# Patient Record
Sex: Male | Born: 1960
Health system: Southern US, Community
[De-identification: ages and names within clinical notes are randomized; demographics above are authoritative.]

## PROBLEM LIST (undated history)

## (undated) DIAGNOSIS — K219 Gastro-esophageal reflux disease without esophagitis: Secondary | ICD-10-CM

## (undated) DIAGNOSIS — J302 Other seasonal allergic rhinitis: Secondary | ICD-10-CM

## (undated) DIAGNOSIS — Z8619 Personal history of other infectious and parasitic diseases: Secondary | ICD-10-CM

## (undated) DIAGNOSIS — J101 Influenza due to other identified influenza virus with other respiratory manifestations: Secondary | ICD-10-CM

## (undated) DIAGNOSIS — K648 Other hemorrhoids: Secondary | ICD-10-CM

## (undated) DIAGNOSIS — J45909 Unspecified asthma, uncomplicated: Secondary | ICD-10-CM

## (undated) DIAGNOSIS — I959 Hypotension, unspecified: Secondary | ICD-10-CM

## (undated) DIAGNOSIS — K225 Diverticulum of esophagus, acquired: Secondary | ICD-10-CM

## (undated) HISTORY — DX: Personal history of other infectious and parasitic diseases: Z86.19

## (undated) HISTORY — DX: Other hemorrhoids: K64.8

## (undated) HISTORY — DX: Hypotension, unspecified: I95.9

## (undated) HISTORY — PX: HERNIA REPAIR: SHX51

## (undated) HISTORY — DX: Gastro-esophageal reflux disease without esophagitis: K21.9

## (undated) HISTORY — DX: Diverticulum of esophagus, acquired: K22.5

## (undated) HISTORY — DX: Other seasonal allergic rhinitis: J30.2

## (undated) HISTORY — DX: Unspecified asthma, uncomplicated: J45.909

---
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
## 2008-05-14 HISTORY — PX: COLONOSCOPY: SHX174

## 2008-11-22 ENCOUNTER — Encounter: Payer: Self-pay | Admitting: Internal Medicine

## 2008-11-23 ENCOUNTER — Encounter: Payer: Self-pay | Admitting: Internal Medicine

## 2009-01-07 ENCOUNTER — Ambulatory Visit: Payer: Self-pay | Admitting: Internal Medicine

## 2009-01-20 ENCOUNTER — Ambulatory Visit: Payer: Self-pay | Admitting: Internal Medicine

## 2009-01-20 HISTORY — PX: COLONOSCOPY: SHX174

## 2010-06-13 NOTE — Miscellaneous (Signed)
Summary: previsit prep rx  Clinical Lists Changes  Medications: Added new medication of MOVIPREP 100 GM  SOLR (PEG-KCL-NACL-NASULF-NA ASC-C) As per prep instructions. - Signed Rx of MOVIPREP 100 GM  SOLR (PEG-KCL-NACL-NASULF-NA ASC-C) As per prep instructions.;  #1 x 0;  Signed;  Entered by: Jennye Boroughs RN;  Authorized by: Hilarie Fredrickson MD;  Method used: Electronically to [facility]. #[id number]*, [address] [place], [place], [place]  [address], Ph: [phone], Fax: [phone]    Prescriptions: MOVIPREP 100 GM  SOLR (PEG-KCL-NACL-NASULF-NA ASC-C) As per prep instructions.  #1 x 0   Entered by:   Jennye Boroughs RN   Authorized by:   Hilarie Fredrickson MD   Signed by:   Jennye Boroughs RN on 01/07/2009   Method used:   Electronically to        [facility]. [phone]* (retail)       [address]       [place], [place]  [address]       Ph: [phone]       Fax: [phone]   RxID:   [id number]

## 2010-06-13 NOTE — Letter (Signed)
Summary: [facility]   Imported By: Lester Guttenberg 01/28/2009 09:41:40  _____________________________________________________________________  External Attachment:    Type:   Image     Comment:   External Document

## 2010-06-13 NOTE — Letter (Signed)
Summary: Referral form/[facility]  Referral form/[facility]   Imported By: Lester Manhattan 01/28/2009 09:39:15  _____________________________________________________________________  External Attachment:    Type:   Image     Comment:   External Document

## 2010-06-13 NOTE — Procedures (Signed)
Summary: Colonoscopy   Colonoscopy  Procedure date:  01/20/2009  Findings:      Location:  [facility].    Procedures Next Due Date:    Colonoscopy: 01/2019 COLONOSCOPY PROCEDURE REPORT  PATIENT:  Samuel Chapman, Samuel Chapman  MR#:  [id number] BIRTHDATE:   27-Aug-1960, 48 yrs. old   GENDER:   male  ENDOSCOPIST:   Wilhemina Bonito. Eda Keys, MD Referred by: Talbot Grumbling. Creta Levin, M.D.  PROCEDURE DATE:  01/20/2009 PROCEDURE:  Average-risk screening colonoscopy ASA CLASS:   Class I INDICATIONS: Routine Risk Screening   MEDICATIONS:    Fentanyl 75 mcg IV, Versed 8 mg IV  DESCRIPTION OF PROCEDURE:   After the risks benefits and alternatives of the procedure were thoroughly explained, informed consent was obtained.  Digital rectal exam was performed and revealed no abnormalities.   The LB CF-[id number] endoscope was introduced through the anus and advanced to the cecum, which was identified by both the appendix and ileocecal valve, without limitations.TIME TO CECUM = 3:03 MIN.  The quality of the prep was excellent, using MoviPrep.  The instrument was then withdrawn (TIME = 13:30 MIN) as the colon was fully examined. <<PROCEDUREIMAGES>>        <<OLD IMAGES>>  FINDINGS:  A normal appearing cecum, ileocecal valve, and appendiceal orifice were identified. The ascending, hepatic flexure, transverse, splenic flexure, descending, sigmoid colon, and rectum appeared unremarkable.  No polyps or cancers were seen.   Retroflexed views in the rectum revealed internal hemorrhoids.    The scope was then withdrawn from the patient and the procedure completed.  COMPLICATIONS:   None  ENDOSCOPIC IMPRESSION:  1) Normal colon  2) No polyps or cancers  3) Internal hemorrhoids RECOMMENDATIONS:  1) Continue current colorectal screening recommendations for "routine risk" patients with a repeat colonoscopy in 10 years.    _______________________________ Wilhemina Bonito. Eda Keys, MD  CC: Halina Maidens, MD;   The Patient

## 2012-09-10 LAB — LIPID PANEL
Cholesterol, Total: 234
HDL: 78 mg/dL — AB (ref 35–70)
LDL (calc): 145
Triglycerides: 57

## 2012-09-10 LAB — COMPREHENSIVE METABOLIC PANEL
ALT: 19
AST: 19 U/L
Alkaline Phosphatase: 50 U/L
Bilirubin: 0.4
Creat: 1
Glucose: 96
Potassium: 5 mmol/L
Sodium: 139

## 2012-09-10 LAB — CBC
HGB: 14.6 g/dL
WBC: 4.3
platelet count: 288

## 2012-09-10 LAB — TSH: TSH: 1.53

## 2012-09-15 LAB — PSA: PSA: 0.87

## 2014-01-01 ENCOUNTER — Encounter: Payer: Self-pay | Admitting: Internal Medicine

## 2014-05-14 HISTORY — PX: ESOPHAGOGASTRODUODENOSCOPY: SHX1529

## 2014-08-16 ENCOUNTER — Encounter: Payer: Self-pay | Admitting: Family Medicine

## 2014-08-16 ENCOUNTER — Ambulatory Visit (INDEPENDENT_AMBULATORY_CARE_PROVIDER_SITE_OTHER): Payer: BLUE CROSS/BLUE SHIELD | Admitting: Family Medicine

## 2014-08-16 VITALS — BP 110/80 | HR 64 | Temp 98.3°F | Ht 68.0 in | Wt 158.0 lb

## 2014-08-16 DIAGNOSIS — K219 Gastro-esophageal reflux disease without esophagitis: Secondary | ICD-10-CM | POA: Diagnosis not present

## 2014-08-16 DIAGNOSIS — J302 Other seasonal allergic rhinitis: Secondary | ICD-10-CM | POA: Insufficient documentation

## 2014-08-16 DIAGNOSIS — K648 Other hemorrhoids: Secondary | ICD-10-CM | POA: Diagnosis not present

## 2014-08-16 MED ORDER — OMEPRAZOLE 40 MG PO CPDR: 40.0000 mg | DELAYED_RELEASE_CAPSULE | Freq: Every day | ORAL | Status: DC

## 2014-08-16 NOTE — Assessment & Plan Note (Signed)
Adequate control on daily antihistamine.

## 2014-08-16 NOTE — Assessment & Plan Note (Addendum)
Recent bleed has resolved.

## 2014-08-16 NOTE — Patient Instructions (Addendum)
Let's try omeprazole 40mg  daily 30 min prior to lunch for next 2-3 weeks to see if this resolves cough and trouble swallowing. If worsening, let me know.  Return as needed or in 3-4 months for annual exam.

## 2014-08-16 NOTE — Progress Notes (Signed)
BP 110/80 mmHg  Pulse 64  Temp(Src) 98.3 F (36.8 C) (Oral)  Ht  (1.727 m)  Wt 158 lb (71.668 kg)  BMI 24.03 kg/m2   CC: new pt to establish  Subjective:    Patient ID: Samuel Chapman, male    DOB: [birth year], 54 y.o.   MRN: [id number]  HPI: Samuel Chapman is a 54 y.o. male presenting on 08/16/2014 for Establish Care   Prior saw Dr Creta Levin in [place] (she left practice).  Last CPE 2 yrs ago.   1.5 wks ago with painless rectal bleeding - told due to int hemorrhoids. Stool test negative. Some straining. Started metamucil. Current normal is several soft stools a day. Colonoscopy WNL 2010 (int hem).   Seasonal allergies - controlled with claritin  daily. Tree pollen allergy.   4 mo h/o mild esophageal dysphagia and reflux worse with bread and lettuce. Also notices dry cough present. Sour taste in throat. Mainly with solids, no significant trouble with liquids. No odynophagia, nausea/vomiting, early satiety, unexpected weight loss. No known GERD sxs.   Grandmother with h/o swallowing trouble of unknown etiology but no cancer.   Preventative: Last CPE 2 yrs ago COLONOSCOPY Date: 2010 int hem, rpt 10 yrs [name]) Prostate cancer screening - has had done in past. Will discuss at CPE. Flu - 02/2014 Tetanus - unsure  Lives with husband Photographer [place]) Edu: university Occupation: works at [employer]: gym, works with Psychologist, educational, pilates, walks dog daily Diet: good water, fruits/vegetables daily  Relevant past medical, surgical, family and social history reviewed and updated as indicated. Interim medical history since our last visit reviewed. Allergies and medications reviewed and updated. No current outpatient prescriptions on file prior to visit.   No current facility-administered medications on file prior to visit.    Review of Systems Per HPI unless specifically indicated above     Objective:    BP 110/80 mmHg  Pulse 64  Temp(Src) 98.3 F (36.8  C) (Oral)  Ht  (1.727 m)  Wt 158 lb (71.668 kg)  BMI 24.03 kg/m2  Wt Readings from Last 3 Encounters:  08/16/14 158 lb (71.668 kg)    Physical Exam  Constitutional: He is oriented to person, place, and time. He appears well-developed and well-nourished. No distress.  HENT:  Head: Normocephalic and atraumatic.  Right Ear: Hearing and external ear normal.  Left Ear: Hearing and external ear normal.  Nose: Nose normal.  Mouth/Throat: Uvula is midline, oropharynx is clear and moist and mucous membranes are normal. No oropharyngeal exudate, posterior oropharyngeal edema or posterior oropharyngeal erythema.  Eyes: Conjunctivae and EOM are normal. Pupils are equal, round, and reactive to light. No scleral icterus.  Neck: Normal range of motion. Neck supple. No thyromegaly present.  Cardiovascular: Normal rate, regular rhythm, normal heart sounds and intact distal pulses.   No murmur heard. Pulses:      Radial pulses are 2+ on the right side, and 2+ on the left side.  Pulmonary/Chest: Effort normal and breath sounds normal. No respiratory distress. He has no wheezes. He has no rales.  Abdominal: Soft. Bowel sounds are normal. He exhibits no distension and no mass. There is no tenderness. There is no rebound and no guarding.  Musculoskeletal: Normal range of motion. He exhibits no edema.  Lymphadenopathy:    He has no cervical adenopathy.  Neurological: He is alert and oriented to person, place, and time.  CN grossly intact, station and gait intact  Skin: Skin is warm and  dry. No rash noted.  Psychiatric: He has a normal mood and affect. His behavior is normal. Judgment and thought content normal.  Nursing note and vitals reviewed.  No results found for this or any previous visit.    Assessment & Plan:   Problem List Items Addressed This Visit    Seasonal allergic rhinitis    Adequate control on daily antihistamine.      Internal hemorrhoid    Recent bleed has resolved.       GERD (gastroesophageal reflux disease) - Primary    No significant red flags. Anticipate GERD related mild dysphagia and cough. Will treat with omeprazole 40mg  daily x 3 wks then prn. If persistent sxs, would consider referral to GI.       Relevant Medications   omeprazole (PRILOSEC) capsule       Follow up plan: Return in about 4 months (around 12/16/2014), or as needed, for annual exam, prior fasting for blood work.

## 2014-08-16 NOTE — Assessment & Plan Note (Signed)
No significant red flags. Anticipate GERD related mild dysphagia and cough. Will treat with omeprazole 40mg  daily x 3 wks then prn. If persistent sxs, would consider referral to GI.

## 2014-08-16 NOTE — Progress Notes (Signed)
Pre visit review using our clinic review tool, if applicable. No additional management support is needed unless otherwise documented below in the visit note. 

## 2014-08-18 ENCOUNTER — Ambulatory Visit (INDEPENDENT_AMBULATORY_CARE_PROVIDER_SITE_OTHER): Payer: BLUE CROSS/BLUE SHIELD | Admitting: Primary Care

## 2014-08-18 ENCOUNTER — Encounter: Payer: Self-pay | Admitting: Primary Care

## 2014-08-18 VITALS — BP 132/76 | HR 110 | Temp 99.1°F | Ht 68.0 in | Wt 158.8 lb

## 2014-08-18 DIAGNOSIS — R05 Cough: Secondary | ICD-10-CM | POA: Diagnosis not present

## 2014-08-18 DIAGNOSIS — R059 Cough, unspecified: Secondary | ICD-10-CM

## 2014-08-18 MED ORDER — AZITHROMYCIN 250 MG PO TABS
ORAL_TABLET | ORAL | Status: DC
Start: 2014-08-18 — End: 2014-12-27

## 2014-08-18 MED ORDER — HYDROCODONE-HOMATROPINE 5-1.5 MG/5ML PO SYRP: 5.0000 mL | ORAL_SOLUTION | Freq: Every evening | ORAL | Status: DC | PRN

## 2014-08-18 MED ORDER — MOMETASONE FUROATE 50 MCG/ACT NA SUSP: 2.0000 | Freq: Every day | NASAL | Status: DC

## 2014-08-18 NOTE — Patient Instructions (Signed)
Start the Z-Pak antibiotic for your cough until gone.  Use the Hycodan cough syrup at bedtime because it will make you drowsy. Use the Nasonex nasal steroid to help with congestion. Please let me know if you have no improvement in the next 3-4 days. I hope you feel better soon!

## 2014-08-18 NOTE — Progress Notes (Signed)
Pre visit review using our clinic review tool, if applicable. No additional management support is needed unless otherwise documented below in the visit note. 

## 2014-08-18 NOTE — Progress Notes (Signed)
Subjective:    Patient ID: Samuel Chapman, male    DOB: 1960-09-21, 54 y.o.   MRN: [id number]  HPI  Samuel Chapman is a 54 year old male who presents today with a chief complaint of cough. The cough has been present for about 2-3 weeks and initially began as a light dry cough at night that was worse after laying down.  He's been treated for GERD with omeprazole and he feels slightly better. Last night he noticed his cough worsened and became productive with some clear, thick, sputum production andsome sinus pressure starting. He was unable to rest much last night and reports feeling fatigued.  He took an hour nap today and feels slightly better. He's been taking daily Claritin since beginning of March and tried an OTC cough suppressant last night with temporary relief.   Review of Systems  Constitutional: Negative for fever and chills.  HENT: Positive for congestion, postnasal drip and sinus pressure. Negative for ear pain and sore throat.   Respiratory: Positive for cough. Negative for shortness of breath.   Cardiovascular: Negative for chest pain.  Gastrointestinal: Negative for nausea and vomiting.  Musculoskeletal: Positive for myalgias.  Neurological: Positive for headaches.       Past Medical History  Diagnosis Date  . RAD (reactive airway disease)     ?of this vs mild asthma  . History of chicken pox   . Seasonal allergic rhinitis     tree pollen  . Internal hemorrhoid     History   Social History  . Marital Status: Married    Spouse Name: N/A  . Number of Children: N/A  . Years of Education: N/A   Occupational History  . Not on file.   Social History Main Topics  . Smoking status: Never Smoker   . Smokeless tobacco: Never Used  . Alcohol Use: 0.0 oz/week    0 Standard drinks or equivalent per week     Comment: 3 glasses wine /day  . Drug Use: No  . Sexual Activity:    Partners: Male   Other Topics Concern  . Not on file   Social History Narrative   Lives with  husband Scientist, water quality(Massimo Fantechi)   Edu: university   Occupation: works at [employer]'s company   Activity: gym, works with Psychologist, educationaltrainer, pilates, walks dog daily   Diet: good water, fruits/vegetables daily    Past Surgical History  Procedure Laterality Date  . Hernia repair  1963, 1966  . Colonoscopy  2010    int hem, rpt 10 yrs [name]([place])    Family History  Problem Relation Age of Onset  . Cancer Mother 2158    lung (smoker)  . Ulcers Father 8370    s/p gastrectomy, stomach hemorrhage  . Cancer Paternal Grandfather     brain?  . CAD Maternal Grandfather   . Stroke Father 55    mild   . Diabetes Brother     borderline    No Known Allergies  Current Outpatient Prescriptions on File Prior to Visit  Medication Sig Dispense Refill  . loratadine (CLARITIN) 10 MG tablet Take 10 mg by mouth daily.    . Multiple Vitamin (MULTIVITAMIN) tablet Take 1 tablet by mouth daily.    Marland Kitchen. omeprazole (PRILOSEC) 40 MG capsule Take 1 capsule (40 mg total) by mouth daily. For 3 weeks then as needed 30 capsule 2   No current facility-administered medications on file prior to visit.    BP 132/76 mmHg  Pulse 110  Temp(Src) 99.1 F (37.3 C) (Oral)  Ht  (1.727 m)  Wt 158 lb 12.8 oz (72.031 kg)  BMI 24.15 kg/m2  SpO2 98%    Objective:   Physical Exam  Constitutional: He is oriented to person, place, and time.  Appears sickly, low grade fever present.  HENT:  Right Ear: External ear normal.  Left Ear: External ear normal.  Nose: Nose normal.  Mouth/Throat: Oropharynx is clear and moist. No oropharyngeal exudate.  Eyes: EOM are normal. Pupils are equal, round, and reactive to light.  Neck: Neck supple.  Cardiovascular: Regular rhythm.   Sinus tachycardia at 112 during exam.  Pulmonary/Chest: Effort normal. He has no decreased breath sounds. He has rales.  Lymphadenopathy:    He has no cervical adenopathy.  Neurological: He is alert and oriented to person, place, and time.  Skin: Skin is warm and  dry.          Assessment & Plan:  Acute Bronchitis:  Due to systemic symptoms of low grade fever and tachycardia; and duration of symptoms, prescribed a Zpak for a suspicious bacterial bronchitis and for prevention of pneumonia. Hycodan cough suppressant for cough and sleep. Nasonex for nasal congestion Follow up if no improvement in 3-4 days.

## 2014-09-22 ENCOUNTER — Encounter: Payer: Self-pay | Admitting: *Deleted

## 2014-12-18 ENCOUNTER — Other Ambulatory Visit: Payer: Self-pay | Admitting: Family Medicine

## 2014-12-18 DIAGNOSIS — Z13220 Encounter for screening for lipoid disorders: Secondary | ICD-10-CM

## 2014-12-18 DIAGNOSIS — Z125 Encounter for screening for malignant neoplasm of prostate: Secondary | ICD-10-CM

## 2014-12-18 DIAGNOSIS — Z131 Encounter for screening for diabetes mellitus: Secondary | ICD-10-CM

## 2014-12-20 ENCOUNTER — Other Ambulatory Visit (INDEPENDENT_AMBULATORY_CARE_PROVIDER_SITE_OTHER): Payer: BLUE CROSS/BLUE SHIELD

## 2014-12-20 DIAGNOSIS — Z125 Encounter for screening for malignant neoplasm of prostate: Secondary | ICD-10-CM

## 2014-12-20 DIAGNOSIS — Z131 Encounter for screening for diabetes mellitus: Secondary | ICD-10-CM | POA: Diagnosis not present

## 2014-12-20 DIAGNOSIS — Z13220 Encounter for screening for lipoid disorders: Secondary | ICD-10-CM

## 2014-12-20 LAB — LIPID PANEL
Cholesterol: 229 mg/dL — ABNORMAL HIGH (ref 0–200)
HDL: 78.4 mg/dL (ref >39.00)
LDL Cholesterol: 135 mg/dL — ABNORMAL HIGH (ref 0–99)
NonHDL: 150.71
Total CHOL/HDL Ratio: 3
Triglycerides: 81 mg/dL (ref 0.0–149.0)
VLDL: 16.2 mg/dL (ref 0.0–40.0)

## 2014-12-20 LAB — BASIC METABOLIC PANEL
BUN: 19 mg/dL (ref 6–23)
CO2: 29 mEq/L (ref 19–32)
Calcium: 10 mg/dL (ref 8.4–10.5)
Chloride: 102 mEq/L (ref 96–112)
Creatinine, Ser: 1.02 mg/dL (ref 0.40–1.50)
GFR: 80.3 mL/min (ref >60.00)
Glucose, Bld: 94 mg/dL (ref 70–99)
Potassium: 4.8 mEq/L (ref 3.5–5.1)
Sodium: 141 mEq/L (ref 135–145)

## 2014-12-20 LAB — PSA: PSA: 0.9 ng/mL (ref 0.10–4.00)

## 2014-12-27 ENCOUNTER — Ambulatory Visit (INDEPENDENT_AMBULATORY_CARE_PROVIDER_SITE_OTHER): Payer: BLUE CROSS/BLUE SHIELD | Admitting: Family Medicine

## 2014-12-27 ENCOUNTER — Encounter: Payer: Self-pay | Admitting: Family Medicine

## 2014-12-27 VITALS — BP 118/80 | HR 64 | Temp 97.9°F | Ht 68.0 in | Wt 157.5 lb

## 2014-12-27 DIAGNOSIS — Z0000 Encounter for general adult medical examination without abnormal findings: Secondary | ICD-10-CM | POA: Insufficient documentation

## 2014-12-27 DIAGNOSIS — R059 Cough, unspecified: Secondary | ICD-10-CM

## 2014-12-27 DIAGNOSIS — K219 Gastro-esophageal reflux disease without esophagitis: Secondary | ICD-10-CM

## 2014-12-27 DIAGNOSIS — Z23 Encounter for immunization: Secondary | ICD-10-CM

## 2014-12-27 DIAGNOSIS — R05 Cough: Secondary | ICD-10-CM

## 2014-12-27 DIAGNOSIS — J302 Other seasonal allergic rhinitis: Secondary | ICD-10-CM

## 2014-12-27 MED ORDER — OMEPRAZOLE 40 MG PO CPDR: 40.0000 mg | DELAYED_RELEASE_CAPSULE | Freq: Every day | ORAL | Status: DC

## 2014-12-27 MED ORDER — MOMETASONE FUROATE 50 MCG/ACT NA SUSP: 2.0000 | Freq: Every day | NASAL | Status: DC

## 2014-12-27 NOTE — Addendum Note (Signed)
Addended by: Josph Macho A on: 12/27/2014 09:24 AM   Modules accepted: Orders

## 2014-12-27 NOTE — Assessment & Plan Note (Signed)
Preventative protocols reviewed and updated unless pt declined. Discussed healthy diet and lifestyle.  

## 2014-12-27 NOTE — Assessment & Plan Note (Signed)
Better controlled on omeprazole  daily. Provided with GERD diet. Anticipate cough related to GERD and allergic rhinitis. rec continue omeprazole  daily for next 1-2 months and then reassess cough.  Discussed dysphagia eval options including barium swallow and EGD. Pt opts to restart PPI and INS and then will uppdate if persistent mild dysphagia for barium swallow and/or GI referral for endoscopy.

## 2014-12-27 NOTE — Progress Notes (Signed)
Pre visit review using our clinic review tool, if applicable. No additional management support is needed unless otherwise documented below in the visit note. 

## 2014-12-27 NOTE — Patient Instructions (Addendum)
For cough - continue omeprazole, restart nasal steroid and nasal saline.  Let me know if persistent trouble swallowing or with GERD for barium swallow or GI referral for endoscopy.  I've refilled omeprazole and nasonex for cough - let me know if persistent for barium swallow study.  Tdap today.  Good to see you today, call us with quesitons.  Return as needed or in 1 year for next physical.  Health Maintenance A healthy lifestyle and preventative care can promote health and wellness.  Maintain regular health, dental, and eye exams.  Eat a healthy diet. Foods like vegetables, fruits, whole grains, low-fat dairy products, and lean protein foods contain the nutrients you need and are low in calories. Decrease your intake of foods high in solid fats, added sugars, and salt. Get information about a proper diet from your health care provider, if necessary.  Regular physical exercise is one of the most important things you can do for your health. Most adults should get at least 150 minutes of moderate-intensity exercise (any activity that increases your heart rate and causes you to sweat) each week. In addition, most adults need muscle-strengthening exercises on 2 or more days a week.   Maintain a healthy weight. The body mass index (BMI) is a screening tool to identify possible weight problems. It provides an estimate of body fat based on height and weight. Your health care provider can find your BMI and can help you achieve or maintain a healthy weight. For males 20 years and older:  A BMI below 18.5 is considered underweight.  A BMI of 18.5 to 24.9 is normal.  A BMI of 25 to 29.9 is considered overweight.  A BMI of 30 and above is considered obese.  Maintain normal blood lipids and cholesterol by exercising and minimizing your intake of saturated fat. Eat a balanced diet with plenty of fruits and vegetables. Blood tests for lipids and cholesterol should begin at age 7 and be repeated every 5  years. If your lipid or cholesterol levels are high, you are over age 65, or you are at high risk for heart disease, you may need your cholesterol levels checked more frequently.Ongoing high lipid and cholesterol levels should be treated with medicines if diet and exercise are not working.  If you smoke, find out from your health care provider how to quit. If you do not use tobacco, do not start.  Lung cancer screening is recommended for adults aged 55-80 years who are at high risk for developing lung cancer because of a history of smoking. A yearly low-dose CT scan of the lungs is recommended for people who have at least a 30-pack-year history of smoking and are current smokers or have quit within the past 15 years. A pack year of smoking is smoking an average of 1 pack of cigarettes a day for 1 year (for example, a 30-pack-year history of smoking could mean smoking 1 pack a day for 30 years or 2 packs a day for 15 years). Yearly screening should continue until the smoker has stopped smoking for at least 15 years. Yearly screening should be stopped for people who develop a health problem that would prevent them from having lung cancer treatment.  If you choose to drink alcohol, do not have more than 2 drinks per day. One drink is considered to be 12 oz (360 mL) of beer, 5 oz (150 mL) of wine, or 1.5 oz (45 mL) of liquor.  Avoid the use of street drugs. Do  not share needles with anyone. Ask for help if you need support or instructions about stopping the use of drugs.  High blood pressure causes heart disease and increases the risk of stroke. Blood pressure should be checked at least every 1-2 years. Ongoing high blood pressure should be treated with medicines if weight loss and exercise are not effective.  If you are 71-10 years old, ask your health care provider if you should take aspirin to prevent heart disease.  Diabetes screening involves taking a blood sample to check your fasting blood sugar  level. This should be done once every 3 years after age 71 if you are at a normal weight and without risk factors for diabetes. Testing should be considered at a younger age or be carried out more frequently if you are overweight and have at least 1 risk factor for diabetes.  Colorectal cancer can be detected and often prevented. Most routine colorectal cancer screening begins at the age of 79 and continues through age 20. However, your health care provider may recommend screening at an earlier age if you have risk factors for colon cancer. On a yearly basis, your health care provider may provide home test kits to check for hidden blood in the stool. A small camera at the end of a tube may be used to directly examine the colon (sigmoidoscopy or colonoscopy) to detect the earliest forms of colorectal cancer. Talk to your health care provider about this at age 41 when routine screening begins. A direct exam of the colon should be repeated every 5-10 years through age 36, unless early forms of precancerous polyps or small growths are found.  People who are at an increased risk for hepatitis B should be screened for this virus. You are considered at high risk for hepatitis B if:  You were born in a country where hepatitis B occurs often. Talk with your health care provider about which countries are considered high risk.  Your parents were born in a high-risk country and you have not received a shot to protect against hepatitis B (hepatitis B vaccine).  You have HIV or AIDS.  You use needles to inject street drugs.  You live with, or have sex with, someone who has hepatitis B.  You are a man who has sex with other men (MSM).  You get hemodialysis treatment.  You take certain medicines for conditions like cancer, organ transplantation, and autoimmune conditions.  Hepatitis C blood testing is recommended for all people born from [birth year] through 1967 and any individual with known risk factors for  hepatitis C.  Healthy men should no longer receive prostate-specific antigen (PSA) blood tests as part of routine cancer screening. Talk to your health care provider about prostate cancer screening.  Testicular cancer screening is not recommended for adolescents or adult males who have no symptoms. Screening includes self-exam, a health care provider exam, and other screening tests. Consult with your health care provider about any symptoms you have or any concerns you have about testicular cancer.  Practice safe sex. Use condoms and avoid high-risk sexual practices to reduce the spread of sexually transmitted infections (STIs).  You should be screened for STIs, including gonorrhea and chlamydia if:  You are sexually active and are younger than 24 years.  You are older than 24 years, and your health care provider tells you that you are at risk for this type of infection.  Your sexual activity has changed since you were last screened, and you are at  an increased risk for chlamydia or gonorrhea. Ask your health care provider if you are at risk.  If you are at risk of being infected with HIV, it is recommended that you take a prescription medicine daily to prevent HIV infection. This is called pre-exposure prophylaxis (PrEP). You are considered at risk if:  You are a man who has sex with other men (MSM).  You are a heterosexual man who is sexually active with multiple partners.  You take drugs by injection.  You are sexually active with a partner who has HIV.  Talk with your health care provider about whether you are at high risk of being infected with HIV. If you choose to begin PrEP, you should first be tested for HIV. You should then be tested every 3 months for as long as you are taking PrEP.  Use sunscreen. Apply sunscreen liberally and repeatedly throughout the day. You should seek shade when your shadow is shorter than you. Protect yourself by wearing long sleeves, pants, a  wide-brimmed hat, and sunglasses year round whenever you are outdoors.  Tell your health care provider of new moles or changes in moles, especially if there is a change in shape or color. Also, tell your health care provider if a mole is larger than the size of a pencil eraser.  A one-time screening for abdominal aortic aneurysm (AAA) and surgical repair of large AAAs by ultrasound is recommended for men aged 65-75 years who are current or former smokers.  Stay current with your vaccines (immunizations). Document Released: 10/27/2007 Document Revised: 05/05/2013 Document Reviewed: 09/25/2010 [facility] Patient Information 2015 [place], [place]. This information is not intended to replace advice given to you by your health care provider. Make sure you discuss any questions you have with your health care provider.  Food Choices for Gastroesophageal Reflux Disease When you have gastroesophageal reflux disease (GERD), the foods you eat and your eating habits are very important. Choosing the right foods can help ease the discomfort of GERD. WHAT GENERAL GUIDELINES DO I NEED TO FOLLOW?  Choose fruits, vegetables, whole grains, low-fat dairy products, and low-fat meat, fish, and poultry.  Limit fats such as oils, salad dressings, butter, nuts, and avocado.  Keep a food diary to identify foods that cause symptoms.  Avoid foods that cause reflux. These may be different for different people.  Eat frequent small meals instead of three large meals each day.  Eat your meals slowly, in a relaxed setting.  Limit fried foods.  Cook foods using methods other than frying.  Avoid drinking alcohol.  Avoid drinking large amounts of liquids with your meals.  Avoid bending over or lying down until 2-3 hours after eating. WHAT FOODS ARE NOT RECOMMENDED? The following are some foods and drinks that may worsen your symptoms: Vegetables Tomatoes. Tomato juice. Tomato and spaghetti sauce. Chili peppers. Onion  and garlic. Horseradish. Fruits Oranges, grapefruit, and lemon (fruit and juice). Meats High-fat meats, fish, and poultry. This includes hot dogs, ribs, ham, sausage, salami, and bacon. Dairy Whole milk and chocolate milk. Sour cream. Cream. Butter. Ice cream. Cream cheese.  Beverages Coffee and tea, with or without caffeine. Carbonated beverages or energy drinks. Condiments Hot sauce. Barbecue sauce.  Sweets/Desserts Chocolate and cocoa. Donuts. Peppermint and spearmint. Fats and Oils High-fat foods, including [place] fries and potato chips. Other Vinegar. Strong spices, such as black pepper, white pepper, red pepper, cayenne, curry powder, cloves, ginger, and chili powder. The items listed above may not be a complete list  of foods and beverages to avoid. Contact your dietitian for more information. Document Released: 04/30/2005 Document Revised: 05/05/2013 Document Reviewed: 03/04/2013 [facility] Patient Information 2015 [place], [place]. This information is not intended to replace advice given to you by your health care provider. Make sure you discuss any questions you have with your health care provider.

## 2014-12-27 NOTE — Assessment & Plan Note (Signed)
rec restart INS and nasal saline as well as antihistamine

## 2014-12-27 NOTE — Progress Notes (Signed)
BP 118/80 mmHg  Pulse 64  Temp(Src) 97.9 F (36.6 C) (Oral)  Ht  (1.727 m)  Wt 157 lb 8 oz (71.442 kg)  BMI 23.95 kg/m2   CC: CPE  Subjective:    Patient ID: Samuel Chapman, male    DOB: Jun 09, 1960, 54 y.o.   MRN: [id number]  HPI: Samuel Chapman is a 54 y.o. male presenting on 12/27/2014 for Annual Exam   GERD - improved with PPI. Persistent coughing at night time when laying on back. Previously prescribed hycodan which helped. Still with occasional dysphagia.   Cough - some allergy sxs as well. No rhinorrhea, mild congestion, + PNdrainage, no itchy eyes, no sneezing.   Did have acute bronchitis 08/2014. This resolved.   Preventative: COLONOSCOPY Date: 2010 int hem, rpt 10 yrs [name]) Prostate cancer screening - discussed, requests screening Flu - 02/2014 Tetanus - 2004. Tdap today. Seat belt use discussed Sunscreen use discussed, no changing moles on skin  Lives with husband Scientist, water quality) Edu: university Occupation: works at [employer]	[place]: gym, works with Psychologist, educational, pilates, walks dog daily Diet: good water, fruits/vegetables daily  Relevant past medical, surgical, family and social history reviewed and updated as indicated. Interim medical history since our last visit reviewed. Allergies and medications reviewed and updated. Current Outpatient Prescriptions on File Prior to Visit  Medication Sig  . Multiple Vitamin (MULTIVITAMIN) tablet Take 1 tablet by mouth daily.  Marland Kitchen loratadine (CLARITIN) 10 MG tablet Take 10 mg by mouth daily.   No current facility-administered medications on file prior to visit.    Review of Systems  Constitutional: Negative for fever, chills, activity change, appetite change, fatigue and unexpected weight change.  HENT: Negative for hearing loss.   Eyes: Negative for visual disturbance.  Respiratory: Positive for cough. Negative for chest tightness, shortness of breath and wheezing.   Cardiovascular: Negative for chest pain,  palpitations and leg swelling.  Gastrointestinal: Negative for nausea, vomiting, abdominal pain, diarrhea, constipation, blood in stool and abdominal distention.  Genitourinary: Negative for hematuria and difficulty urinating.  Musculoskeletal: Negative for myalgias, arthralgias and neck pain.  Skin: Negative for rash.  Neurological: Negative for dizziness, seizures, syncope and headaches.  Hematological: Negative for adenopathy. Does not bruise/bleed easily.  Psychiatric/Behavioral: Negative for dysphoric mood. The patient is not nervous/anxious.    Per HPI unless specifically indicated above     Objective:    BP 118/80 mmHg  Pulse 64  Temp(Src) 97.9 F (36.6 C) (Oral)  Ht  (1.727 m)  Wt 157 lb 8 oz (71.442 kg)  BMI 23.95 kg/m2  Wt Readings from Last 3 Encounters:  12/27/14 157 lb 8 oz (71.442 kg)  08/18/14 158 lb 12.8 oz (72.031 kg)  08/16/14 158 lb (71.668 kg)    Physical Exam  Constitutional: He is oriented to person, place, and time. He appears well-developed and well-nourished. No distress.  HENT:  Head: Normocephalic and atraumatic.  Right Ear: Hearing, tympanic membrane, external ear and ear canal normal.  Left Ear: Hearing, tympanic membrane, external ear and ear canal normal.  Nose: Nose normal.  Mouth/Throat: Uvula is midline, oropharynx is clear and moist and mucous membranes are normal. No oropharyngeal exudate, posterior oropharyngeal edema or posterior oropharyngeal erythema.  Eyes: Conjunctivae and EOM are normal. Pupils are equal, round, and reactive to light. No scleral icterus.  Neck: Normal range of motion. Neck supple. No thyromegaly present.  Cardiovascular: Normal rate, regular rhythm, normal heart sounds and intact distal pulses.   No murmur heard.  Pulses:      Radial pulses are 2+ on the right side, and 2+ on the left side.  Pulmonary/Chest: Effort normal and breath sounds normal. No respiratory distress. He has no wheezes. He has no rales.    Abdominal: Soft. Bowel sounds are normal. He exhibits no distension and no mass. There is no tenderness. There is no rebound and no guarding.  Genitourinary: Rectum normal and prostate normal. Rectal exam shows no external hemorrhoid, no internal hemorrhoid, no fissure, no mass, no tenderness and anal tone normal. Prostate is not enlarged (15gm) and not tender.  Musculoskeletal: Normal range of motion. He exhibits no edema.  Lymphadenopathy:    He has no cervical adenopathy.  Neurological: He is alert and oriented to person, place, and time.  CN grossly intact, station and gait intact  Skin: Skin is warm and dry. No rash noted.  Psychiatric: He has a normal mood and affect. His behavior is normal. Judgment and thought content normal.  Nursing note and vitals reviewed.  Results for orders placed or performed in visit on 12/20/14  Lipid panel  Result Value Ref Range   Cholesterol 229 (H) 0 - 200 mg/dL   Triglycerides 16.1 0.0 - 149.0 mg/dL   HDL 09.60 >45.40 mg/dL   VLDL 98.1 0.0 - 19.1 mg/dL   LDL Cholesterol 478 (H) 0 - 99 mg/dL   Total CHOL/HDL Ratio 3    NonHDL 150.71   Basic metabolic panel  Result Value Ref Range   Sodium 141 135 - 145 mEq/L   Potassium 4.8 3.5 - 5.1 mEq/L   Chloride 102 96 - 112 mEq/L   CO2 29 19 - 32 mEq/L   Glucose, Bld 94 70 - 99 mg/dL   BUN 19 6 - 23 mg/dL   Creatinine, Ser 2.95 0.40 - 1.50 mg/dL   Calcium 62.1 8.4 - 30.8 mg/dL   GFR 65.78 >46.96 mL/min  PSA  Result Value Ref Range   PSA 0.90 0.10 - 4.00 ng/mL      Assessment & Plan:   Problem List Items Addressed This Visit    Seasonal allergic rhinitis    rec restart INS and nasal saline as well as antihistamine      GERD (gastroesophageal reflux disease)    Better controlled on omeprazole 40mg  daily. Provided with GERD diet. Anticipate cough related to GERD and allergic rhinitis. rec continue omeprazole 40mg  daily for next 1-2 months and then reassess cough.  Discussed dysphagia eval  options including barium swallow and EGD. Pt opts to restart PPI and INS and then will uppdate if persistent mild dysphagia for barium swallow and/or GI referral for endoscopy.      Relevant Medications   omeprazole (PRILOSEC) 40 MG capsule   Health maintenance examination - Primary    Preventative protocols reviewed and updated unless pt declined. Discussed healthy diet and lifestyle.        Other Visit Diagnoses    Cough        Relevant Medications    mometasone (NASONEX) 50 MCG/ACT nasal spray        Follow up plan: Return in about 1 year (around 12/27/2015), or as needed, for annual exam, prior fasting for blood work.

## 2015-02-28 ENCOUNTER — Telehealth: Payer: Self-pay | Admitting: Internal Medicine

## 2015-02-28 NOTE — Telephone Encounter (Signed)
Pt states that his PCP has been seeing him for reflux and he has been taking prilosec 40mg  daily. States this is not working and he is waking up at night coughing. Pt feels this is due to reflux. Pt scheduled to see Dr. Marina GoodellPerry 04/11/15@9am . Pt aware of appt.

## 2015-02-28 NOTE — Telephone Encounter (Signed)
Left message for pt to call back  °

## 2015-04-11 ENCOUNTER — Ambulatory Visit (INDEPENDENT_AMBULATORY_CARE_PROVIDER_SITE_OTHER): Payer: BLUE CROSS/BLUE SHIELD | Admitting: Internal Medicine

## 2015-04-11 ENCOUNTER — Encounter: Payer: Self-pay | Admitting: Internal Medicine

## 2015-04-11 VITALS — BP 106/80 | HR 60 | Ht 68.5 in | Wt 157.0 lb

## 2015-04-11 DIAGNOSIS — R1310 Dysphagia, unspecified: Secondary | ICD-10-CM | POA: Diagnosis not present

## 2015-04-11 DIAGNOSIS — K219 Gastro-esophageal reflux disease without esophagitis: Secondary | ICD-10-CM | POA: Diagnosis not present

## 2015-04-11 NOTE — Progress Notes (Signed)
HISTORY OF PRESENT ILLNESS:  Samuel Chapman is a 54 y.o. male who is self-referred regarding chief complaints of acid reflux and dysphagia. The patient underwent routine screening colonoscopy 01/20/2009. This was entirely normal except for internal hemorrhoids. Follow-up in 10 years recommended. Current history is one year of intermittent solid food dysphagia item such as bread. Reports gurgling sensation in the pharynx as well as occasional pyrosis. Was having problems with persistent cough after what sounds like respiratory infection for which his PCP recommended omeprazole  REVIEW OF SYSTEMS:  All non-GI ROS negative except for sinus and allergy, hearing problems  Past Medical History  Diagnosis Date  . RAD (reactive airway disease)     ?of this vs mild asthma  . History of chicken pox   . Seasonal allergic rhinitis     tree pollen  . Internal hemorrhoid   . GERD (gastroesophageal reflux disease)     Past Surgical History  Procedure Laterality Date  . Hernia repair  1963, 1966  . Colonoscopy  2010    int hem, rpt 10 yrs [name](Hayleen Clinkscales)    Social History Samuel Chapman  reports that he has never smoked. He has never used smokeless tobacco. He reports that he drinks about 12.6 oz of alcohol per week. He reports that he does not use illicit drugs.  family history includes CAD in his maternal grandfather; Cancer in his paternal grandfather; Cancer (age of onset: 6758) in his mother; Diabetes in his brother; Stroke (age of onset: 6855) in his father; Ulcers (age of onset: 5470) in his father.  No Known Allergies     PHYSICAL EXAMINATION: Vital signs: BP 106/80 mmHg  Pulse 60  Ht 5' 8.5" (1.74 m)  Wt 157 lb (71.215 kg)  BMI 23.52 kg/m2  Constitutional: generally well-appearing, no acute distress Psychiatric: alert and oriented x3, cooperative Eyes: extraocular movements intact, anicteric, conjunctiva pink Mouth: oral pharynx moist, no lesions Neck: supple without thyromegaly Lymph: no  lymphadenopathy Cardiovascular: heart regular rate and rhythm, no murmur Lungs: clear to auscultation bilaterally Abdomen: soft, nontender, nondistended, no obvious ascites, no peritoneal signs, normal bowel sounds, no organomegaly Rectal: Omitted Extremities: no clubbing cyanosis or lower extremity edema bilaterally Skin: no lesions on visible extremities Neuro: No focal deficits. Cranial nerves intact.    ASSESSMENT:  #1. Intermittent solid food dysphagia. Rule out peptic stricture or ring #2. Probable GERD #3. Normal screening colonoscopy September 2010   PLAN:  #1. Reflux precautions #2. Continue omeprazole for now #3. Upper endoscopy with probable esophageal dilation.The nature of the procedure, as well as the risks, benefits, and alternatives were carefully and thoroughly reviewed with the patient. Ample time for discussion and questions allowed. The patient understood, was satisfied, and agreed to proceed. #4. Routine screening colonoscopy around September 2020

## 2015-04-11 NOTE — Patient Instructions (Signed)
You have been scheduled for an endoscopy. Please follow written instructions given to you at your visit today. If you use inhalers (even only as needed), please bring them with you on the day of your procedure.   

## 2015-04-14 DIAGNOSIS — K225 Diverticulum of esophagus, acquired: Secondary | ICD-10-CM

## 2015-04-14 HISTORY — DX: Diverticulum of esophagus, acquired: K22.5

## 2015-04-22 ENCOUNTER — Telehealth: Payer: Self-pay

## 2015-04-22 ENCOUNTER — Ambulatory Visit (AMBULATORY_SURGERY_CENTER): Payer: BLUE CROSS/BLUE SHIELD | Admitting: Internal Medicine

## 2015-04-22 ENCOUNTER — Encounter: Payer: Self-pay | Admitting: Internal Medicine

## 2015-04-22 ENCOUNTER — Other Ambulatory Visit: Payer: Self-pay

## 2015-04-22 VITALS — BP 113/72 | HR 64 | Temp 97.7°F | Resp 15 | Ht 68.5 in | Wt 157.0 lb

## 2015-04-22 DIAGNOSIS — R1310 Dysphagia, unspecified: Secondary | ICD-10-CM

## 2015-04-22 DIAGNOSIS — K225 Diverticulum of esophagus, acquired: Secondary | ICD-10-CM | POA: Diagnosis not present

## 2015-04-22 MED ORDER — SODIUM CHLORIDE 0.9 % IV SOLN: 500.0000 mL | INTRAVENOUS | Status: DC

## 2015-04-22 NOTE — Progress Notes (Signed)
SPOKE WITH DOTTIE TO GIVE LINDA HUNT THE MESSAGE TO SET UP ESOPHAGRAM BEGINNING OF THE WEEK SINCE PT GOING ON TRIP THURSDAY

## 2015-04-22 NOTE — Patient Instructions (Signed)
YOU HAD AN ENDOSCOPIC PROCEDURE TODAY AT THE [facility]:   Refer to the procedure report that was given to you for any specific questions about what was found during the examination.  If the procedure report does not answer your questions, please call your gastroenterologist to clarify.  If you requested that your care partner not be given the details of your procedure findings, then the procedure report has been included in a sealed envelope for you to review at your convenience later.  YOU SHOULD EXPECT: Some feelings of bloating in the abdomen. Passage of more gas than usual.  Walking can help get rid of the air that was put into your GI tract during the procedure and reduce the bloating. If you had a lower endoscopy (such as a colonoscopy or flexible sigmoidoscopy) you may notice spotting of blood in your stool or on the toilet paper. If you underwent a bowel prep for your procedure, you may not have a normal bowel movement for a few days.  Please Note:  You might notice some irritation and congestion in your nose or some drainage.  This is from the oxygen used during your procedure.  There is no need for concern and it should clear up in a day or so.  SYMPTOMS TO REPORT IMMEDIATELY:     Following upper endoscopy (EGD)  Vomiting of blood or coffee ground material  New chest pain or pain under the shoulder blades  Painful or persistently difficult swallowing  New shortness of breath  Fever of 100F or higher  Black, tarry-looking stools  For urgent or emergent issues, a gastroenterologist can be reached at any hour by calling [phone].   DIET: Your first meal following the procedure should be a small meal and then it is ok to progress to your normal diet. Heavy or fried foods are harder to digest and may make you feel nauseous or bloated.  Likewise, meals heavy in dairy and vegetables can increase bloating.  Drink plenty of fluids but you should avoid alcoholic beverages  for 24 hours.  ACTIVITY:  You should plan to take it easy for the rest of today and you should NOT DRIVE or use heavy machinery until tomorrow (because of the sedation medicines used during the test).    FOLLOW UP: Our staff will call the number listed on your records the next business day following your procedure to check on you and address any questions or concerns that you may have regarding the information given to you following your procedure. If we do not reach you, we will leave a message.  However, if you are feeling well and you are not experiencing any problems, there is no need to return our call.  We will assume that you have returned to your regular daily activities without incident.  If any biopsies were taken you will be contacted by phone or by letter within the next 1-3 weeks.  Please call us at [phone] if you have not heard about the biopsies in 3 weeks.    SIGNATURES/CONFIDENTIALITY: You and/or your care partner have signed paperwork which will be entered into your electronic medical record.  These signatures attest to the fact that that the information above on your After Visit Summary has been reviewed and is understood.  Full responsibility of the confidentiality of this discharge information lies with you and/or your care-partner.   ESOPHAGRAM WILL BE SCHEDULED BY DR PERRY'S OFFICE AND THEY WILL NOTIFY YOU OF DETAILS, TIME AND  DATE . DR PERRY'S OFFICE WILL SET UP A REFERRAL TO SEE A SURGEON ALSO .

## 2015-04-22 NOTE — Op Note (Signed)
[facility] [address] [place], [id number]   ENDOSCOPY PROCEDURE REPORT  PATIENT: [name], Samuel  MR#: [id number] BIRTHDATE: [birth year] , 54  yrs. old GENDER: male ENDOSCOPIST: Roxy CedarJohn N Nina Hoar Jr, MD REFERRED BY:  Eustaquio BoydenJavier Gutierrez, M.D. PROCEDURE DATE:  04/22/2015 PROCEDURE:  EGD, diagnostic ASA CLASS:     Class I INDICATIONS:  dysphagia. MEDICATIONS: Propofol 150 mg IV and Monitored anesthesia care TOPICAL ANESTHETIC: none  DESCRIPTION OF PROCEDURE: After the risks benefits and alternatives of the procedure were thoroughly explained, informed consent was obtained.  The LB [id number] endoscope was introduced through the mouth and advanced to the second portion of the duodenum , Without limitations.  The instrument was slowly withdrawn as the mucosa was fully examined.   There was a large hypopharyngeal diverticulum (Zenker's) with large bolus of foodstuff.  The esophagus proper was normal throughout. The stomach was normal.  The duodenum was normal.  Retroflexed views revealed a hiatal hernia.     The scope was then withdrawn from the patient and the procedure completed.  COMPLICATIONS: There were no immediate complications.  ENDOSCOPIC IMPRESSION: 1. Moderately large Zenker's diverticulum 2. Otherwise normal EGD  RECOMMENDATIONS: 1. My office will arrange Cine esophagram / Barium esophagram "Zenker's diverticulum, evaluate". 2. Will refer to tertiary care center for Zenker's diverticulectomy  REPEAT EXAM:  eSigned:  Roxy CedarJohn N Myah Guynes Jr, MD 04/22/2015 11:26 AM    CC:The Patient and Eustaquio BoydenGutierrez, Javier MD

## 2015-04-22 NOTE — Progress Notes (Signed)
Report to PACU, RN, vss, BBS= Clear.  

## 2015-04-22 NOTE — Telephone Encounter (Signed)
Pt [facility] 04/27/15@9 :30am, pt to arrive there at 9:15am. Pt to be NPO after 6:30am. Pt aware of appt.

## 2015-04-25 ENCOUNTER — Telehealth: Payer: Self-pay

## 2015-04-25 NOTE — Telephone Encounter (Signed)
  Follow up Call-  Call back number 04/22/2015  Post procedure Call Back phone  # [phone]  Permission to leave phone message Yes     Patient questions:  Do you have a fever, pain , or abdominal swelling? No. Pain Score  0 *  Have you tolerated food without any problems? Yes.    Have you been able to return to your normal activities? Yes.    Do you have any questions about your discharge instructions: Diet   No. Medications  No. Follow up visit  No.  Do you have questions or concerns about your Care? No.  Actions: * If pain score is 4 or above: No action needed, pain <4.

## 2015-04-26 ENCOUNTER — Encounter: Payer: Self-pay | Admitting: Family Medicine

## 2015-04-27 ENCOUNTER — Ambulatory Visit (HOSPITAL_COMMUNITY)
Admission: RE | Admit: 2015-04-27 | Discharge: 2015-04-27 | Disposition: A | Discharge status: Home / Self Care | Payer: BLUE CROSS/BLUE SHIELD | Source: Ambulatory Visit | Attending: Internal Medicine | Admitting: Internal Medicine

## 2015-04-27 ENCOUNTER — Encounter: Payer: Self-pay | Admitting: Internal Medicine

## 2015-04-27 DIAGNOSIS — R1310 Dysphagia, unspecified: Secondary | ICD-10-CM | POA: Diagnosis not present

## 2015-04-27 DIAGNOSIS — K225 Diverticulum of esophagus, acquired: Secondary | ICD-10-CM | POA: Diagnosis not present

## 2015-05-14 ENCOUNTER — Encounter: Payer: Self-pay | Admitting: Internal Medicine

## 2015-05-19 ENCOUNTER — Encounter: Payer: Self-pay | Admitting: Internal Medicine

## 2015-06-22 ENCOUNTER — Encounter: Payer: Self-pay | Admitting: Internal Medicine

## 2015-07-26 ENCOUNTER — Encounter: Payer: Self-pay | Admitting: Internal Medicine

## 2015-07-27 ENCOUNTER — Encounter: Payer: Self-pay | Admitting: Internal Medicine

## 2015-07-28 ENCOUNTER — Encounter: Payer: Self-pay | Admitting: Internal Medicine

## 2015-07-28 ENCOUNTER — Telehealth: Payer: Self-pay | Admitting: Internal Medicine

## 2015-07-28 NOTE — Telephone Encounter (Signed)
Noted  

## 2015-08-13 HISTORY — PX: ZENKER'S DIVERTICULECTOMY ENDOSCOPIC: SHX6191

## 2015-08-29 ENCOUNTER — Encounter: Payer: Self-pay | Admitting: Internal Medicine

## 2015-09-14 DIAGNOSIS — K225 Diverticulum of esophagus, acquired: Secondary | ICD-10-CM | POA: Diagnosis not present

## 2015-09-15 ENCOUNTER — Encounter: Payer: Self-pay | Admitting: Family Medicine

## 2015-10-13 ENCOUNTER — Other Ambulatory Visit: Payer: Self-pay | Admitting: Family Medicine

## 2015-12-25 ENCOUNTER — Other Ambulatory Visit: Payer: Self-pay | Admitting: Family Medicine

## 2015-12-25 DIAGNOSIS — Z1159 Encounter for screening for other viral diseases: Secondary | ICD-10-CM

## 2015-12-25 DIAGNOSIS — E785 Hyperlipidemia, unspecified: Secondary | ICD-10-CM | POA: Insufficient documentation

## 2015-12-25 DIAGNOSIS — Z125 Encounter for screening for malignant neoplasm of prostate: Secondary | ICD-10-CM

## 2015-12-26 ENCOUNTER — Other Ambulatory Visit (INDEPENDENT_AMBULATORY_CARE_PROVIDER_SITE_OTHER): Payer: BLUE CROSS/BLUE SHIELD

## 2015-12-26 DIAGNOSIS — E785 Hyperlipidemia, unspecified: Secondary | ICD-10-CM | POA: Diagnosis not present

## 2015-12-26 DIAGNOSIS — Z125 Encounter for screening for malignant neoplasm of prostate: Secondary | ICD-10-CM

## 2015-12-26 DIAGNOSIS — Z1159 Encounter for screening for other viral diseases: Secondary | ICD-10-CM

## 2015-12-26 LAB — BASIC METABOLIC PANEL
BUN: 14 mg/dL (ref 6–23)
CO2: 28 mEq/L (ref 19–32)
Calcium: 9.4 mg/dL (ref 8.4–10.5)
Chloride: 102 mEq/L (ref 96–112)
Creatinine, Ser: 0.98 mg/dL (ref 0.40–1.50)
GFR: 83.79 mL/min (ref >60.00)
Glucose, Bld: 90 mg/dL (ref 70–99)
Potassium: 4.9 mEq/L (ref 3.5–5.1)
Sodium: 138 mEq/L (ref 135–145)

## 2015-12-26 LAB — LIPID PANEL
Cholesterol: 243 mg/dL — ABNORMAL HIGH (ref 0–200)
HDL: 83.6 mg/dL (ref >39.00)
LDL Cholesterol: 139 mg/dL — ABNORMAL HIGH (ref 0–99)
NonHDL: 159.15
Total CHOL/HDL Ratio: 3
Triglycerides: 99 mg/dL (ref 0.0–149.0)
VLDL: 19.8 mg/dL (ref 0.0–40.0)

## 2015-12-26 LAB — PSA: PSA: 0.88 ng/mL (ref 0.10–4.00)

## 2015-12-29 ENCOUNTER — Encounter: Payer: Self-pay | Admitting: Family Medicine

## 2015-12-29 ENCOUNTER — Ambulatory Visit (INDEPENDENT_AMBULATORY_CARE_PROVIDER_SITE_OTHER): Payer: BLUE CROSS/BLUE SHIELD | Admitting: Family Medicine

## 2015-12-29 VITALS — BP 124/84 | HR 68 | Temp 97.9°F | Ht 68.0 in | Wt 156.8 lb

## 2015-12-29 DIAGNOSIS — K219 Gastro-esophageal reflux disease without esophagitis: Secondary | ICD-10-CM

## 2015-12-29 DIAGNOSIS — E785 Hyperlipidemia, unspecified: Secondary | ICD-10-CM

## 2015-12-29 DIAGNOSIS — Z9889 Other specified postprocedural states: Secondary | ICD-10-CM | POA: Diagnosis not present

## 2015-12-29 DIAGNOSIS — Z0000 Encounter for general adult medical examination without abnormal findings: Secondary | ICD-10-CM | POA: Diagnosis not present

## 2015-12-29 DIAGNOSIS — Z8719 Personal history of other diseases of the digestive system: Secondary | ICD-10-CM

## 2015-12-29 NOTE — Assessment & Plan Note (Signed)
Tapering off ppi. Zenker's procedure has significantly helped with this.

## 2015-12-29 NOTE — Assessment & Plan Note (Signed)
Appreciate [facility] assistance.

## 2015-12-29 NOTE — Progress Notes (Signed)
BP 124/84   Pulse 68   Temp 97.9 F (36.6 C) (Oral)   Ht 5\' 8"  (1.727 m)   Wt 156 lb 12 oz (71.1 kg)   BMI 23.83 kg/m    CC: CPE Subjective:    Patient ID: Samuel Chapman, male    DOB: 01/16/1961, 55 y.o.   MRN: [id number]  HPI: Samuel PoliceRodney Villegas is a 55 y.o. male presenting on 12/29/2015 for Annual Exam   Zencker's diverticulum s/p divert[facility]lect[facility] at [facility]. PRN omeprazole.   Preventative: COLONOSCOPY Date: 2010 int hem, rpt 10 yrs [name]([facility]) Prostate cancer screening - discussed, requests screening  Flu - yearly Tdap 2016 Seat belt use discussed Sunscreen use discussed, no changing moles on skin Nonsmoker Alcohol - 2-3 glasses of wine/day. Never >5 drinks.   Lives with husband Photographer(Massimo SelmaFantechi) Edu: university Occupation: works at [employer] Activity: gym, works with trainer twice weekly, pilates, walks dog daily  Diet: good water, fruits/vegetables daily   Relevant past medical, surgical, family and social history reviewed and updated as indicated. Interim medical history since our last visit reviewed. Allergies and medications reviewed and updated. Current Outpatient Prescriptions on File Prior to Visit  Medication Sig  . loratadine (CLARITIN) 10 MG tablet Take 10 mg by mouth daily.  . mometasone (NASONEX) 50 MCG/ACT nasal spray Place 2 sprays into the nose daily.  . Multiple Vitamin (MULTIVITAMIN) tablet Take 1 tablet by mouth daily.  Marland Kitchen. omeprazole (PRILOSEC) 40 MG capsule TAKE ONE CAPSULE BY MOUTH EVERY DAY FOR 3 WEEKS THEN AS NEEDED   No current facility-administered medications on file prior to visit.     Review of Systems  Constitutional: Negative for activity change, appetite change, chills, fatigue, fever and unexpected weight change.  HENT: Negative for hearing loss.   Eyes: Negative for visual disturbance.  Respiratory: Negative for cough, chest tightness, shortness of breath and wheezing.   Cardiovascular: Negative for chest pain, palpitations and leg  swelling.  Gastrointestinal: Negative for abdominal distention, abdominal pain, blood in stool, constipation, diarrhea, nausea and vomiting.  Genitourinary: Negative for difficulty urinating and hematuria.  Musculoskeletal: Negative for arthralgias, myalgias and neck pain.  Skin: Negative for rash.  Neurological: Negative for dizziness, seizures, syncope and headaches.  Hematological: Negative for adenopathy. Does not bruise/bleed easily.  Psychiatric/Behavioral: Negative for dysphoric mood. The patient is not nervous/anxious.    Per HPI unless specifically indicated in ROS section     Objective:    BP 124/84   Pulse 68   Temp 97.9 F (36.6 C) (Oral)   Ht 5\' 8"  (1.727 m)   Wt 156 lb 12 oz (71.1 kg)   BMI 23.83 kg/m   Wt Readings from Last 3 Encounters:  12/29/15 156 lb 12 oz (71.1 kg)  04/22/15 157 lb (71.2 kg)  04/11/15 157 lb (71.2 kg)    Physical Exam  Constitutional: He is oriented to person, place, and time. He appears well-developed and well-nourished. No distress.  HENT:  Head: Normocephalic and atraumatic.  Right Ear: Hearing, tympanic membrane, external ear and ear canal normal.  Left Ear: Hearing, tympanic membrane, external ear and ear canal normal.  Nose: Nose normal.  Mouth/Throat: Uvula is midline, oropharynx is clear and moist and mucous membranes are normal. No oropharyngeal exudate, posterior oropharyngeal edema or posterior oropharyngeal erythema.  Eyes: Conjunctivae and EOM are normal. Pupils are equal, round, and reactive to light. No scleral icterus.  Neck: Normal range of motion. Neck supple. No thyromegaly present.  Cardiovascular: Normal rate, regular rhythm,  normal heart sounds and intact distal pulses.   No murmur heard. Pulses:      Radial pulses are 2+ on the right side, and 2+ on the left side.  Pulmonary/Chest: Effort normal and breath sounds normal. No respiratory distress. He has no wheezes. He has no rales.  Abdominal: Soft. Bowel sounds are  normal. He exhibits no distension and no mass. There is no tenderness. There is no rebound and no guarding.  Genitourinary: Rectum normal and prostate normal. Rectal exam shows no external hemorrhoid, no internal hemorrhoid, no fissure, no mass, no tenderness and anal tone normal. Prostate is not enlarged (15gm) and not tender.  Musculoskeletal: Normal range of motion. He exhibits no edema.  Lymphadenopathy:    He has no cervical adenopathy.  Neurological: He is alert and oriented to person, place, and time.  CN grossly intact, station and gait intact  Skin: Skin is warm and dry. No rash noted.  Psychiatric: He has a normal mood and affect. His behavior is normal. Judgment and thought content normal.  Nursing note and vitals reviewed.  Results for orders placed or performed in visit on 12/26/15  Lipid panel  Result Value Ref Range   Cholesterol 243 (H) 0 - 200 mg/dL   Triglycerides 16.199.0 0.0 - 149.0 mg/dL   HDL 09.6083.60 >45.40>39.00 mg/dL   VLDL 98.119.8 0.0 - 19.140.0 mg/dL   LDL Cholesterol 478139 (H) 0 - 99 mg/dL   Total CHOL/HDL Ratio 3    NonHDL 159.15   PSA  Result Value Ref Range   PSA 0.88 0.10 - 4.00 ng/mL  Basic metabolic panel  Result Value Ref Range   Sodium 138 135 - 145 mEq/L   Potassium 4.9 3.5 - 5.1 mEq/L   Chloride 102 96 - 112 mEq/L   CO2 28 19 - 32 mEq/L   Glucose, Bld 90 70 - 99 mg/dL   BUN 14 6 - 23 mg/dL   Creatinine, Ser 2.950.98 0.40 - 1.50 mg/dL   Calcium 9.4 8.4 - 62.110.5 mg/dL   GFR 30.8683.79 >57.84>60.00 mL/min      Assessment & Plan:   Problem List Items Addressed This Visit    GERD (gastroesophageal reflux disease)    Tapering off ppi. Zenker's procedure has significantly helped with this.       Health maintenance examination - Primary    Preventative protocols reviewed and updated unless pt declined. Discussed healthy diet and lifestyle.       History of excision of Zenker's diverticulum    Appreciate [facility] assistance.       HLD (hyperlipidemia)    Predominantly high  HDL  ASCVD risk 5.1%.  No treatment indicated at this time.       Other Visit Diagnoses   None.      Follow up plan: Return in about 1 year (around 12/28/2016), or as needed, for annual exam, prior fasting for blood work.  Eustaquio BoydenJavier Armentha Branagan, MD

## 2015-12-29 NOTE — Progress Notes (Signed)
Pre visit review using our clinic review tool, if applicable. No additional management support is needed unless otherwise documented below in the visit note. 

## 2015-12-29 NOTE — Assessment & Plan Note (Signed)
Predominantly high HDL  ASCVD risk 5.1%.  No treatment indicated at this time.

## 2015-12-29 NOTE — Patient Instructions (Signed)
You are doing well today. Cholesterol overall well control due to high good cholesterol levels. Return as needed or in 1 year for next physical  Health Maintenance, Male A healthy lifestyle and preventative care can promote health and wellness.  Maintain regular health, dental, and eye exams.  Eat a healthy diet. Foods like vegetables, fruits, whole grains, low-fat dairy products, and lean protein foods contain the nutrients you need and are low in calories. Decrease your intake of foods high in solid fats, added sugars, and salt. Get information about a proper diet from your health care provider, if necessary.  Regular physical exercise is one of the most important things you can do for your health. Most adults should get at least 150 minutes of moderate-intensity exercise (any activity that increases your heart rate and causes you to sweat) each week. In addition, most adults need muscle-strengthening exercises on 2 or more days a week.   Maintain a healthy weight. The body mass index (BMI) is a screening tool to identify possible weight problems. It provides an estimate of body fat based on height and weight. Your health care provider can find your BMI and can help you achieve or maintain a healthy weight. For males 20 years and older:  A BMI below 18.5 is considered underweight.  A BMI of 18.5 to 24.9 is normal.  A BMI of 25 to 29.9 is considered overweight.  A BMI of 30 and above is considered obese.  Maintain normal blood lipids and cholesterol by exercising and minimizing your intake of saturated fat. Eat a balanced diet with plenty of fruits and vegetables. Blood tests for lipids and cholesterol should begin at age 57 and be repeated every 5 years. If your lipid or cholesterol levels are high, you are over age 57, or you are at high risk for heart disease, you may need your cholesterol levels checked more frequently.Ongoing high lipid and cholesterol levels should be treated with  medicines if diet and exercise are not working.  If you smoke, find out from your health care provider how to quit. If you do not use tobacco, do not start.  Lung cancer screening is recommended for adults aged 55-80 years who are at high risk for developing lung cancer because of a history of smoking. A yearly low-dose CT scan of the lungs is recommended for people who have at least a 30-pack-year history of smoking and are current smokers or have quit within the past 15 years. A pack year of smoking is smoking an average of 1 pack of cigarettes a day for 1 year (for example, a 30-pack-year history of smoking could mean smoking 1 pack a day for 30 years or 2 packs a day for 15 years). Yearly screening should continue until the smoker has stopped smoking for at least 15 years. Yearly screening should be stopped for people who develop a health problem that would prevent them from having lung cancer treatment.  If you choose to drink alcohol, do not have more than 2 drinks per day. One drink is considered to be 12 oz (360 mL) of beer, 5 oz (150 mL) of wine, or 1.5 oz (45 mL) of liquor.  Avoid the use of street drugs. Do not share needles with anyone. Ask for help if you need support or instructions about stopping the use of drugs.  High blood pressure causes heart disease and increases the risk of stroke. High blood pressure is more likely to develop in:  People who have  blood pressure in the end of the normal range (100-139/85-89 mm Hg).  People who are overweight or obese.  People who are African American.  If you are 66-70 years of age, have your blood pressure checked every 3-5 years. If you are 87 years of age or older, have your blood pressure checked every year. You should have your blood pressure measured twice--once when you are at a hospital or clinic, and once when you are not at a hospital or clinic. Record the average of the two measurements. To check your blood pressure when you are not  at a hospital or clinic, you can use:  An automated blood pressure machine at a pharmacy.  A home blood pressure monitor.  If you are 60-46 years old, ask your health care provider if you should take aspirin to prevent heart disease.  Diabetes screening involves taking a blood sample to check your fasting blood sugar level. This should be done once every 3 years after age 62 if you are at a normal weight and without risk factors for diabetes. Testing should be considered at a younger age or be carried out more frequently if you are overweight and have at least 1 risk factor for diabetes.  Colorectal cancer can be detected and often prevented. Most routine colorectal cancer screening begins at the age of 66 and continues through age 47. However, your health care provider may recommend screening at an earlier age if you have risk factors for colon cancer. On a yearly basis, your health care provider may provide home test kits to check for hidden blood in the stool. A small camera at the end of a tube may be used to directly examine the colon (sigmoidoscopy or colonoscopy) to detect the earliest forms of colorectal cancer. Talk to your health care provider about this at age 41 when routine screening begins. A direct exam of the colon should be repeated every 5-10 years through age 55, unless early forms of precancerous polyps or small growths are found.  People who are at an increased risk for hepatitis B should be screened for this virus. You are considered at high risk for hepatitis B if:  You were born in a country where hepatitis B occurs often. Talk with your health care provider about which countries are considered high risk.  Your parents were born in a high-risk country and you have not received a shot to protect against hepatitis B (hepatitis B vaccine).  You have HIV or AIDS.  You use needles to inject street drugs.  You live with, or have sex with, someone who has hepatitis B.  You  are a man who has sex with other men (MSM).  You get hemodialysis treatment.  You take certain medicines for conditions like cancer, organ transplantation, and autoimmune conditions.  Hepatitis C blood testing is recommended for all people born from [birth year] through 1967 and any individual with known risk factors for hepatitis C.  Healthy men should no longer receive prostate-specific antigen (PSA) blood tests as part of routine cancer screening. Talk to your health care provider about prostate cancer screening.  Testicular cancer screening is not recommended for adolescents or adult males who have no symptoms. Screening includes self-exam, a health care provider exam, and other screening tests. Consult with your health care provider about any symptoms you have or any concerns you have about testicular cancer.  Practice safe sex. Use condoms and avoid high-risk sexual practices to reduce the spread of sexually transmitted infections (  STIs).  You should be screened for STIs, including gonorrhea and chlamydia if:  You are sexually active and are younger than 24 years.  You are older than 24 years, and your health care provider tells you that you are at risk for this type of infection.  Your sexual activity has changed since you were last screened, and you are at an increased risk for chlamydia or gonorrhea. Ask your health care provider if you are at risk.  If you are at risk of being infected with HIV, it is recommended that you take a prescription medicine daily to prevent HIV infection. This is called pre-exposure prophylaxis (PrEP). You are considered at risk if:  You are a man who has sex with other men (MSM).  You are a heterosexual man who is sexually active with multiple partners.  You take drugs by injection.  You are sexually active with a partner who has HIV.  Talk with your health care provider about whether you are at high risk of being infected with HIV. If you choose to begin  PrEP, you should first be tested for HIV. You should then be tested every 3 months for as long as you are taking PrEP.  Use sunscreen. Apply sunscreen liberally and repeatedly throughout the day. You should seek shade when your shadow is shorter than you. Protect yourself by wearing long sleeves, pants, a wide-brimmed hat, and sunglasses year round whenever you are outdoors.  Tell your health care provider of new moles or changes in moles, especially if there is a change in shape or color. Also, tell your health care provider if a mole is larger than the size of a pencil eraser.  A one-time screening for abdominal aortic aneurysm (AAA) and surgical repair of large AAAs by ultrasound is recommended for men aged 52-75 years who are current or former smokers.  Stay current with your vaccines (immunizations).   This information is not intended to replace advice given to you by your health care provider. Make sure you discuss any questions you have with your health care provider.   Document Released: 10/27/2007 Document Revised: 05/21/2014 Document Reviewed: 09/25/2010 Elsevier Interactive Patient Education [employer].

## 2015-12-29 NOTE — Assessment & Plan Note (Signed)
Preventative protocols reviewed and updated unless pt declined. Discussed healthy diet and lifestyle.  

## 2016-01-09 DIAGNOSIS — Z23 Encounter for immunization: Secondary | ICD-10-CM | POA: Diagnosis not present

## 2016-01-27 ENCOUNTER — Ambulatory Visit (INDEPENDENT_AMBULATORY_CARE_PROVIDER_SITE_OTHER): Payer: BLUE CROSS/BLUE SHIELD | Admitting: Family Medicine

## 2016-01-27 ENCOUNTER — Ambulatory Visit: Payer: BLUE CROSS/BLUE SHIELD | Admitting: Family Medicine

## 2016-01-27 ENCOUNTER — Encounter: Payer: Self-pay | Admitting: Family Medicine

## 2016-01-27 DIAGNOSIS — R591 Generalized enlarged lymph nodes: Secondary | ICD-10-CM | POA: Diagnosis not present

## 2016-01-27 DIAGNOSIS — R599 Enlarged lymph nodes, unspecified: Secondary | ICD-10-CM

## 2016-01-27 NOTE — Progress Notes (Signed)
Pre visit review using our clinic review tool, if applicable. No additional management support is needed unless otherwise documented below in the visit note. 

## 2016-01-27 NOTE — Progress Notes (Signed)
H/o zenker's diverticulum corrected prev, in his throat.   Recently noted lump near the R ear, near the parotid on the R side.  Still present.  H/o cold sore on R side lower lip, about the same time of the R jaw lesion.  No FCNAVD.  He doesn't have h/o parotid troubles in the past.  No ear pain.  No cough, no vomiting.    Travelling to [place] in the near future, about 1 week from now.    Meds, vitals, and allergies reviewed.   ROS: Per HPI unless specifically indicated in ROS section   GEN: nad, alert and oriented HEENT: mucous membranes moist, tm wnl B, nasal and OP exam wnl. Single mass noted near R ear, in the preauricular LA, small, feels <1cm, likely small LN.   NECK: supple w/o LA o/w CV: rrr PULM: ctab, no inc wob ABD: soft, +bs EXT: no edema SKIN: no acute rash

## 2016-01-27 NOTE — Patient Instructions (Signed)
Likely a small reactive lymph node.  You shouldn't have to do anything about this.  If enlarging or persistent, then update us.   Take care.  Glad to see you.

## 2016-01-29 DIAGNOSIS — R599 Enlarged lymph nodes, unspecified: Secondary | ICD-10-CM | POA: Insufficient documentation

## 2016-01-29 NOTE — Assessment & Plan Note (Addendum)
He had a recent cold sore, that appeared about the same time as the mass. It could be that the two are related. He likely has a small reactive lymph node. It small and is not especially tender. Discussed with patient. Observation only for now. If not resolved he can update us. He agrees. No other treatment now.

## 2016-11-26 DIAGNOSIS — D2239 Melanocytic nevi of other parts of face: Secondary | ICD-10-CM | POA: Diagnosis not present

## 2016-11-26 DIAGNOSIS — D1801 Hemangioma of skin and subcutaneous tissue: Secondary | ICD-10-CM | POA: Diagnosis not present

## 2016-11-26 DIAGNOSIS — L821 Other seborrheic keratosis: Secondary | ICD-10-CM | POA: Diagnosis not present

## 2016-11-26 DIAGNOSIS — L738 Other specified follicular disorders: Secondary | ICD-10-CM | POA: Diagnosis not present

## 2016-12-24 ENCOUNTER — Other Ambulatory Visit: Payer: Self-pay | Admitting: Family Medicine

## 2016-12-24 DIAGNOSIS — E785 Hyperlipidemia, unspecified: Secondary | ICD-10-CM

## 2016-12-24 DIAGNOSIS — Z1159 Encounter for screening for other viral diseases: Secondary | ICD-10-CM

## 2016-12-24 DIAGNOSIS — R599 Enlarged lymph nodes, unspecified: Secondary | ICD-10-CM

## 2016-12-24 DIAGNOSIS — Z125 Encounter for screening for malignant neoplasm of prostate: Secondary | ICD-10-CM

## 2016-12-27 ENCOUNTER — Other Ambulatory Visit (INDEPENDENT_AMBULATORY_CARE_PROVIDER_SITE_OTHER): Payer: BLUE CROSS/BLUE SHIELD

## 2016-12-27 DIAGNOSIS — E785 Hyperlipidemia, unspecified: Secondary | ICD-10-CM

## 2016-12-27 DIAGNOSIS — Z125 Encounter for screening for malignant neoplasm of prostate: Secondary | ICD-10-CM | POA: Diagnosis not present

## 2016-12-27 DIAGNOSIS — R599 Enlarged lymph nodes, unspecified: Secondary | ICD-10-CM | POA: Diagnosis not present

## 2016-12-27 DIAGNOSIS — Z1159 Encounter for screening for other viral diseases: Secondary | ICD-10-CM

## 2016-12-27 LAB — LIPID PANEL
Cholesterol: 216 mg/dL — ABNORMAL HIGH (ref 0–200)
HDL: 77.4 mg/dL (ref >39.00)
LDL Cholesterol: 120 mg/dL — ABNORMAL HIGH (ref 0–99)
NonHDL: 138.85
Total CHOL/HDL Ratio: 3
Triglycerides: 96 mg/dL (ref 0.0–149.0)
VLDL: 19.2 mg/dL (ref 0.0–40.0)

## 2016-12-27 LAB — CBC WITH DIFFERENTIAL/PLATELET
Basophils Absolute: 0.1 10*3/uL (ref 0.0–0.1)
Basophils Relative: 1.1 % (ref 0.0–3.0)
Eosinophils Absolute: 0.5 10*3/uL (ref 0.0–0.7)
Eosinophils Relative: 8.3 % — ABNORMAL HIGH (ref 0.0–5.0)
HCT: 46.8 % (ref 39.0–52.0)
Hemoglobin: 15.8 g/dL (ref 13.0–17.0)
Lymphocytes Relative: 42.1 % (ref 12.0–46.0)
Lymphs Abs: 2.4 10*3/uL (ref 0.7–4.0)
MCHC: 33.8 g/dL (ref 30.0–36.0)
MCV: 95 fl (ref 78.0–100.0)
Monocytes Absolute: 0.3 10*3/uL (ref 0.1–1.0)
Monocytes Relative: 5.4 % (ref 3.0–12.0)
Neutro Abs: 2.4 10*3/uL (ref 1.4–7.7)
Neutrophils Relative %: 43.1 % (ref 43.0–77.0)
Platelets: 292 10*3/uL (ref 150.0–400.0)
RBC: 4.92 Mil/uL (ref 4.22–5.81)
RDW: 13.4 % (ref 11.5–15.5)
WBC: 5.6 10*3/uL (ref 4.0–10.5)

## 2016-12-27 LAB — BASIC METABOLIC PANEL
BUN: 14 mg/dL (ref 6–23)
CO2: 32 mEq/L (ref 19–32)
Calcium: 9.6 mg/dL (ref 8.4–10.5)
Chloride: 100 mEq/L (ref 96–112)
Creatinine, Ser: 1 mg/dL (ref 0.40–1.50)
GFR: 81.57 mL/min (ref >60.00)
Glucose, Bld: 97 mg/dL (ref 70–99)
Potassium: 4.3 mEq/L (ref 3.5–5.1)
Sodium: 138 mEq/L (ref 135–145)

## 2016-12-27 LAB — PSA: PSA: 0.94 ng/mL (ref 0.10–4.00)

## 2016-12-28 LAB — HEPATITIS C ANTIBODY: HCV Ab: NONREACTIVE

## 2016-12-31 ENCOUNTER — Ambulatory Visit (INDEPENDENT_AMBULATORY_CARE_PROVIDER_SITE_OTHER): Payer: BLUE CROSS/BLUE SHIELD | Admitting: Family Medicine

## 2016-12-31 ENCOUNTER — Encounter: Payer: Self-pay | Admitting: Family Medicine

## 2016-12-31 VITALS — BP 114/74 | HR 67 | Temp 98.0°F | Wt 155.5 lb

## 2016-12-31 DIAGNOSIS — Z8719 Personal history of other diseases of the digestive system: Secondary | ICD-10-CM

## 2016-12-31 DIAGNOSIS — E785 Hyperlipidemia, unspecified: Secondary | ICD-10-CM | POA: Diagnosis not present

## 2016-12-31 DIAGNOSIS — Z9889 Other specified postprocedural states: Secondary | ICD-10-CM

## 2016-12-31 DIAGNOSIS — Z0000 Encounter for general adult medical examination without abnormal findings: Secondary | ICD-10-CM | POA: Diagnosis not present

## 2016-12-31 NOTE — Progress Notes (Signed)
BP 114/74   Pulse 67   Temp 98 F (36.7 C) (Oral)   Wt 155 lb 8 oz (70.5 kg)   SpO2 97%   BMI 23.64 kg/m    CC: CPE Subjective:    Patient ID: Samuel Chapman, male    DOB: July 07, 1960, 56 y.o.   MRN: [id number]  HPI: Samuel Chapman is a 56 y.o. male presenting on 12/31/2016 for Annual Exam and Dysphagia (since having throat surgery last yr )   Zencker's diverticulum s/p diverticulectomy intraluminal approach at [facility] 08/2015. PRN omeprazole 40mg .  Feels he's doing well from this. Mild dysphagia with spicy foods and vinegar but this is much better.   Preventative: COLONOSCOPY Date: 2010 int hem, rpt 10 yrs [name]) Prostate cancer screening - discussed, requests screening yearly Flu yearly Tdap 2016 Seat belt use discussed Sunscreen use discussed, no changing moles on skin. Sees derm regularly.  Nonsmoker Alcohol - 2-3 glasses of wine/day. Never >5 drinks.   Lives with husband Photographer Franklin) Edu: university Occupation: works at [employer]	[place]: gym, works with trainer twice weekly, pilates, walks dog daily  Diet: good water, fruits/vegetables daily   Relevant past medical, surgical, family and social history reviewed and updated as indicated. Interim medical history since our last visit reviewed. Allergies and medications reviewed and updated. Outpatient Medications Prior to Visit  Medication Sig Dispense Refill  . loratadine (CLARITIN) 10 MG tablet Take 10 mg by mouth daily.    . mometasone (NASONEX) 50 MCG/ACT nasal spray Place 2 sprays into the nose daily. 17 g 3  . Multiple Vitamin (MULTIVITAMIN) tablet Take 1 tablet by mouth daily.    Marland Kitchen omeprazole (PRILOSEC) 40 MG capsule TAKE ONE CAPSULE BY MOUTH EVERY DAY FOR 3 WEEKS THEN AS NEEDED 30 capsule 3   No facility-administered medications prior to visit.      Per HPI unless specifically indicated in ROS section below Review of Systems  Constitutional: Negative for activity change, appetite change, chills,  fatigue, fever and unexpected weight change.  HENT: Negative for hearing loss.   Eyes: Negative for visual disturbance.  Respiratory: Negative for cough, chest tightness, shortness of breath and wheezing.   Cardiovascular: Negative for chest pain, palpitations and leg swelling.  Gastrointestinal: Negative for abdominal distention, abdominal pain, blood in stool, constipation, diarrhea, nausea and vomiting.  Genitourinary: Negative for difficulty urinating and hematuria.  Musculoskeletal: Negative for arthralgias, myalgias and neck pain.  Skin: Negative for rash.  Neurological: Negative for dizziness, seizures, syncope and headaches.  Hematological: Negative for adenopathy. Does not bruise/bleed easily.  Psychiatric/Behavioral: Negative for dysphoric mood. The patient is not nervous/anxious.        Objective:    BP 114/74   Pulse 67   Temp 98 F (36.7 C) (Oral)   Wt 155 lb 8 oz (70.5 kg)   SpO2 97%   BMI 23.64 kg/m   Wt Readings from Last 3 Encounters:  12/31/16 155 lb 8 oz (70.5 kg)  01/27/16 158 lb 4 oz (71.8 kg)  12/29/15 156 lb 12 oz (71.1 kg)    Physical Exam  Constitutional: He is oriented to person, place, and time. He appears well-developed and well-nourished. No distress.  HENT:  Head: Normocephalic and atraumatic.  Right Ear: Hearing, tympanic membrane, external ear and ear canal normal.  Left Ear: Hearing, tympanic membrane, external ear and ear canal normal.  Nose: Nose normal.  Mouth/Throat: Uvula is midline, oropharynx is clear and moist and mucous membranes are normal. No oropharyngeal exudate, posterior  oropharyngeal edema or posterior oropharyngeal erythema.  Eyes: Pupils are equal, round, and reactive to light. Conjunctivae and EOM are normal. No scleral icterus.  Neck: Normal range of motion. Neck supple.  Cardiovascular: Normal rate, regular rhythm, normal heart sounds and intact distal pulses.   No murmur heard. Pulses:      Radial pulses are 2+ on the  right side, and 2+ on the left side.  Pulmonary/Chest: Effort normal and breath sounds normal. No respiratory distress. He has no wheezes. He has no rales.  Abdominal: Soft. Bowel sounds are normal. He exhibits no distension and no mass. There is no tenderness. There is no rebound and no guarding.  Musculoskeletal: Normal range of motion. He exhibits no edema.  Lymphadenopathy:    He has no cervical adenopathy.  Neurological: He is alert and oriented to person, place, and time.  CN grossly intact, station and gait intact  Skin: Skin is warm and dry. No rash noted.  Psychiatric: He has a normal mood and affect. His behavior is normal. Judgment and thought content normal.  Nursing note and vitals reviewed.  Results for orders placed or performed in visit on 12/27/16  Lipid panel  Result Value Ref Range   Cholesterol 216 (H) 0 - 200 mg/dL   Triglycerides 16.1 0.0 - 149.0 mg/dL   HDL 09.60 >45.40 mg/dL   VLDL 98.1 0.0 - 19.1 mg/dL   LDL Cholesterol 478 (H) 0 - 99 mg/dL   Total CHOL/HDL Ratio 3    NonHDL 138.85   Basic metabolic panel  Result Value Ref Range   Sodium 138 135 - 145 mEq/L   Potassium 4.3 3.5 - 5.1 mEq/L   Chloride 100 96 - 112 mEq/L   CO2 32 19 - 32 mEq/L   Glucose, Bld 97 70 - 99 mg/dL   BUN 14 6 - 23 mg/dL   Creatinine, Ser 2.95 0.40 - 1.50 mg/dL   Calcium 9.6 8.4 - 62.1 mg/dL   GFR 30.86 >57.84 mL/min  PSA  Result Value Ref Range   PSA 0.94 0.10 - 4.00 ng/mL  Hepatitis C antibody  Result Value Ref Range   HCV Ab NON-REACTIVE NON-REACTIVE  CBC with Differential/Platelet  Result Value Ref Range   WBC 5.6 4.0 - 10.5 K/uL   RBC 4.92 4.22 - 5.81 Mil/uL   Hemoglobin 15.8 13.0 - 17.0 g/dL   HCT 69.6 29.5 - 28.4 %   MCV 95.0 78.0 - 100.0 fl   MCHC 33.8 30.0 - 36.0 g/dL   RDW 13.2 44.0 - 10.2 %   Platelets 292.0 150.0 - 400.0 K/uL   Neutrophils Relative % 43.1 43.0 - 77.0 %   Lymphocytes Relative 42.1 12.0 - 46.0 %   Monocytes Relative 5.4 3.0 - 12.0 %    Eosinophils Relative 8.3 (H) 0.0 - 5.0 %   Basophils Relative 1.1 0.0 - 3.0 %   Neutro Abs 2.4 1.4 - 7.7 K/uL   Lymphs Abs 2.4 0.7 - 4.0 K/uL   Monocytes Absolute 0.3 0.1 - 1.0 K/uL   Eosinophils Absolute 0.5 0.0 - 0.7 K/uL   Basophils Absolute 0.1 0.0 - 0.1 K/uL      Assessment & Plan:   Problem List Items Addressed This Visit    Health maintenance examination - Primary    Preventative protocols reviewed and updated unless pt declined. Discussed healthy diet and lifestyle.       History of excision of Zenker's diverticulum    Intermittent dysphagia that has significantly improved since  surgery. Advised to let us know if worsening for return to GI.       HLD (hyperlipidemia)    Chronic, stable off meds. Predominantly driven by high HDL.  The 10-year ASCVD risk score Denman George DC Montez Hageman., et al., 2013) is: 4.6%   Values used to calculate the score:     Age: 29 years     Sex: Male     Is Non-Hispanic African American: No     Diabetic: No     Tobacco smoker: No     Systolic Blood Pressure: 114 mmHg     Is BP treated: No     HDL Cholesterol: 77.4 mg/dL     Total Cholesterol: 216 mg/dL           Follow up plan: Return in about 1 year (around 12/31/2017) for annual exam, prior fasting for blood work.  Eustaquio Boyden, MD

## 2016-12-31 NOTE — Assessment & Plan Note (Signed)
Intermittent dysphagia that has significantly improved since surgery. Advised to let us know if worsening for return to GI.

## 2016-12-31 NOTE — Patient Instructions (Signed)
You are doing well today.  Continue current medicines.  Return as needed or in 1 year for next physical.   Health Maintenance, Male A healthy lifestyle and preventive care is important for your health and wellness. Ask your health care provider about what schedule of regular examinations is right for you. What should I know about weight and diet? Eat a Healthy Diet  Eat plenty of vegetables, fruits, whole grains, low-fat dairy products, and lean protein.  Do not eat a lot of foods high in solid fats, added sugars, or salt.  Maintain a Healthy Weight Regular exercise can help you achieve or maintain a healthy weight. You should:  Do at least 150 minutes of exercise each week. The exercise should increase your heart rate and make you sweat (moderate-intensity exercise).  Do strength-training exercises at least twice a week.  Watch Your Levels of Cholesterol and Blood Lipids  Have your blood tested for lipids and cholesterol every 5 years starting at 58 years of age. If you are at high risk for heart disease, you should start having your blood tested when you are 58 years old. You may need to have your cholesterol levels checked more often if: ? Your lipid or cholesterol levels are high. ? You are older than 58 years of age. ? You are at high risk for heart disease.  What should I know about cancer screening? Many types of cancers can be detected early and may often be prevented. Lung Cancer  You should be screened every year for lung cancer if: ? You are a current smoker who has smoked for at least 30 years. ? You are a former smoker who has quit within the past 15 years.  Talk to your health care provider about your screening options, when you should start screening, and how often you should be screened.  Colorectal Cancer  Routine colorectal cancer screening usually begins at 58 years of age and should be repeated every 5-10 years until you are 58 years old. You may need to be  screened more often if early forms of precancerous polyps or small growths are found. Your health care provider may recommend screening at an earlier age if you have risk factors for colon cancer.  Your health care provider may recommend using home test kits to check for hidden blood in the stool.  A small camera at the end of a tube can be used to examine your colon (sigmoidoscopy or colonoscopy). This checks for the earliest forms of colorectal cancer.  Prostate and Testicular Cancer  Depending on your age and overall health, your health care provider may do certain tests to screen for prostate and testicular cancer.  Talk to your health care provider about any symptoms or concerns you have about testicular or prostate cancer.  Skin Cancer  Check your skin from head to toe regularly.  Tell your health care provider about any new moles or changes in moles, especially if: ? There is a change in a mole's size, shape, or color. ? You have a mole that is larger than a pencil eraser.  Always use sunscreen. Apply sunscreen liberally and repeat throughout the day.  Protect yourself by wearing long sleeves, pants, a wide-brimmed hat, and sunglasses when outside.  What should I know about heart disease, diabetes, and high blood pressure?  If you are 18-39 years of age, have your blood pressure checked every 3-5 years. If you are 40 years of age or older, have your blood pressure   checked every year. You should have your blood pressure measured twice-once when you are at a hospital or clinic, and once when you are not at a hospital or clinic. Record the average of the two measurements. To check your blood pressure when you are not at a hospital or clinic, you can use: ? An automated blood pressure machine at a pharmacy. ? A home blood pressure monitor.  Talk to your health care provider about your target blood pressure.  If you are between 45-79 years old, ask your health care provider if you  should take aspirin to prevent heart disease.  Have regular diabetes screenings by checking your fasting blood sugar level. ? If you are at a normal weight and have a low risk for diabetes, have this test once every three years after the age of 45. ? If you are overweight and have a high risk for diabetes, consider being tested at a younger age or more often.  A one-time screening for abdominal aortic aneurysm (AAA) by ultrasound is recommended for men aged 65-75 years who are current or former smokers. What should I know about preventing infection? Hepatitis B If you have a higher risk for hepatitis B, you should be screened for this virus. Talk with your health care provider to find out if you are at risk for hepatitis B infection. Hepatitis C Blood testing is recommended for:  Everyone born from 1945 through 1965.  Anyone with known risk factors for hepatitis C.  Sexually Transmitted Diseases (STDs)  You should be screened each year for STDs including gonorrhea and chlamydia if: ? You are sexually active and are younger than 58 years of age. ? You are older than 58 years of age and your health care provider tells you that you are at risk for this type of infection. ? Your sexual activity has changed since you were last screened and you are at an increased risk for chlamydia or gonorrhea. Ask your health care provider if you are at risk.  Talk with your health care provider about whether you are at high risk of being infected with HIV. Your health care provider may recommend a prescription medicine to help prevent HIV infection.  What else can I do?  Schedule regular health, dental, and eye exams.  Stay current with your vaccines (immunizations).  Do not use any tobacco products, such as cigarettes, chewing tobacco, and e-cigarettes. If you need help quitting, ask your health care provider.  Limit alcohol intake to no more than 2 drinks per day. One drink equals 12 ounces of beer,  5 ounces of wine, or 1 ounces of hard liquor.  Do not use street drugs.  Do not share needles.  Ask your health care provider for help if you need support or information about quitting drugs.  Tell your health care provider if you often feel depressed.  Tell your health care provider if you have ever been abused or do not feel safe at home. This information is not intended to replace advice given to you by your health care provider. Make sure you discuss any questions you have with your health care provider. Document Released: 10/27/2007 Document Revised: 12/28/2015 Document Reviewed: 02/01/2015 Elsevier Interactive Patient Education  2018 Elsevier Inc.  

## 2016-12-31 NOTE — Assessment & Plan Note (Signed)
Preventative protocols reviewed and updated unless pt declined. Discussed healthy diet and lifestyle.  

## 2016-12-31 NOTE — Assessment & Plan Note (Addendum)
Chronic, stable off meds. Predominantly driven by high HDL.  The 10-year ASCVD risk score Denman George DC Montez Hageman., et al., 2013) is: 4.6%   Values used to calculate the score:     Age: 56 years     Sex: Male     Is Non-Hispanic African American: No     Diabetic: No     Tobacco smoker: No     Systolic Blood Pressure: 114 mmHg     Is BP treated: No     HDL Cholesterol: 77.4 mg/dL     Total Cholesterol: 216 mg/dL

## 2017-02-11 DIAGNOSIS — Z23 Encounter for immunization: Secondary | ICD-10-CM | POA: Diagnosis not present

## 2017-02-26 ENCOUNTER — Encounter: Payer: Self-pay | Admitting: Internal Medicine

## 2017-02-26 ENCOUNTER — Ambulatory Visit (INDEPENDENT_AMBULATORY_CARE_PROVIDER_SITE_OTHER): Payer: BLUE CROSS/BLUE SHIELD | Admitting: Internal Medicine

## 2017-02-26 VITALS — BP 110/68 | HR 80 | Ht 68.0 in

## 2017-02-26 DIAGNOSIS — K225 Diverticulum of esophagus, acquired: Secondary | ICD-10-CM

## 2017-02-26 DIAGNOSIS — K219 Gastro-esophageal reflux disease without esophagitis: Secondary | ICD-10-CM

## 2017-02-26 DIAGNOSIS — R1310 Dysphagia, unspecified: Secondary | ICD-10-CM | POA: Diagnosis not present

## 2017-02-26 NOTE — Progress Notes (Signed)
HISTORY OF PRESENT ILLNESS:  Samuel Chapman is a 56 y.o. male who was evaluated in the office November 2016 regarding dysphagia and reflux symptoms. At that time occasional pyrosis and problems with gurgling sensation in the pharynx. He subsequently underwent upper endoscopy 04/22/2015 and was found to have a moderately large Zenker's diverticulum. Otherwise normal EGD. He subsequently underwent endoscopic diverticulectomy with Dr. Wyline Mood at [facility] April 2017. Significant improvement in swallowing thereafter. Patient presents today with chief complaint of gurgling sensation in the pharynx. Mild cough at times. Overall his swallowing has been good. Rare difficulties. He takes PPI on demand for reflux-type symptoms. He was hoping today for "checkup". His last colonoscopy in 2010 was negative for neoplasia. GI review of systems is otherwise negative  REVIEW OF SYSTEMS:  All non-GI ROS negative except for sinus and allergy, cough  Past Medical History:  Diagnosis Date  . GERD (gastroesophageal reflux disease)   . History of chicken pox   . Internal hemorrhoid   . RAD (reactive airway disease)    ?of this vs mild asthma  . Seasonal allergic rhinitis    tree pollen  . Zenker's hypopharyngeal diverticulum 04/2015    Past Surgical History:  Procedure Laterality Date  . COLONOSCOPY  2010   int hem, rpt 10 yrs [name])  . ESOPHAGOGASTRODUODENOSCOPY  2016   mod zenker's diverticulum, referred to tertiary center [name])  . HERNIA REPAIR  1963, 1966  . ZENKER'S DIVERTICULECTOMY ENDOSCOPIC  08/2015   Dr Wyline Mood at [facility]    Social History Samuel Chapman  reports that he has never smoked. He has never used smokeless tobacco. He reports that he drinks about 12.6 oz of alcohol per week . He reports that he does not use drugs.  family history includes CAD in his maternal grandfather; Diabetes in his brother; Lung cancer (age of onset: 45) in his mother; Stroke (age of onset: 55) in his father; Ulcers (age of  onset: 73) in his father.  No Known Allergies     PHYSICAL EXAMINATION: Vital signs: BP 110/68   Pulse 80   Ht  (1.727 m)   Constitutional: generally well-appearing, no acute distress Psychiatric: alert and oriented x3, cooperative Eyes: extraocular movements intact, anicteric, conjunctiva pink Mouth: oral pharynx moist, no lesions Neck: supple no lymphadenopathy Cardiovascular: heart regular rate and rhythm, no murmur Lungs: clear to auscultation bilaterally Abdomen: soft, nontender, nondistended, no obvious ascites, no peritoneal signs, normal bowel sounds, no organomegaly Rectal:Omitted Extremities: no clubbing cyanosis or lower extremity edema bilaterally Skin: no lesions on visible extremities Neuro: No focal deficits. Cranial nerves intact  ASSESSMENT:  #1. History of Zenker's diverticulum status post endoscopic diverticulectomy with Dr. Wyline Mood at [facility] April 2017. Doing well except for minor symptoms at this time including mild dysphagia. #2. GERD. Controlled with on demand PPI. Gurgling sensation secondary to the same. #3. Negative screening colonoscopy 2010   PLAN:  #1. Reassurance #2. On demand PPI #3. Reflux precautions #4. Schedule barium esophagram with tablet to reassess his anatomy and rule out any significant obstructive process #5. Screening colonoscopy 2020  25 minutes spent face-to-face with the patient. Greater than 50% a time use for counseling regarding his Zenker's, swallowing issues, GERD, and to discuss follow-up colon cancer screening and his request

## 2017-02-26 NOTE — Patient Instructions (Signed)
You have been scheduled for a Barium Esophogram at [facility] (1st floor of the hospital) on 03/06/2017 at 11:00am. Please arrive 15 minutes prior to your appointment for registration. Make certain not to have anything to eat or drink 3 hours prior to your test. If you need to reschedule for any reason, please contact radiology at [phone] to do so. __________________________________________________________________ A barium swallow is an examination that concentrates on views of the esophagus. This tends to be a double contrast exam (barium and two liquids which, when combined, create a gas to distend the wall of the oesophagus) or single contrast (non-ionic iodine based). The study is usually tailored to your symptoms so a good history is essential. Attention is paid during the study to the form, structure and configuration of the esophagus, looking for functional disorders (such as aspiration, dysphagia, achalasia, motility and reflux) EXAMINATION You may be asked to change into a gown, depending on the type of swallow being performed. A radiologist and radiographer will perform the procedure. The radiologist will advise you of the type of contrast selected for your procedure and direct you during the exam. You will be asked to stand, sit or lie in several different positions and to hold a small amount of fluid in your mouth before being asked to swallow while the imaging is performed .In some instances you may be asked to swallow barium coated marshmallows to assess the motility of a solid food bolus. The exam can be recorded as a digital or video fluoroscopy procedure. POST PROCEDURE It will take 1-2 days for the barium to pass through your system. To facilitate this, it is important, unless otherwise directed, to increase your fluids for the next 24-48hrs and to resume your normal diet.  This test typically takes about 30 minutes to  perform. __________________________________________________________________________________

## 2017-03-06 ENCOUNTER — Ambulatory Visit (HOSPITAL_COMMUNITY)
Admission: RE | Admit: 2017-03-06 | Discharge: 2017-03-06 | Disposition: A | Discharge status: Home / Self Care | Payer: BLUE CROSS/BLUE SHIELD | Source: Ambulatory Visit | Attending: Internal Medicine | Admitting: Internal Medicine

## 2017-03-06 DIAGNOSIS — K219 Gastro-esophageal reflux disease without esophagitis: Secondary | ICD-10-CM | POA: Diagnosis not present

## 2017-03-06 DIAGNOSIS — K225 Diverticulum of esophagus, acquired: Secondary | ICD-10-CM | POA: Diagnosis not present

## 2017-03-06 DIAGNOSIS — K224 Dyskinesia of esophagus: Secondary | ICD-10-CM | POA: Diagnosis not present

## 2017-03-06 DIAGNOSIS — R1310 Dysphagia, unspecified: Secondary | ICD-10-CM | POA: Diagnosis not present

## 2017-03-08 ENCOUNTER — Encounter: Payer: Self-pay | Admitting: Family Medicine

## 2017-03-08 ENCOUNTER — Ambulatory Visit: Payer: BLUE CROSS/BLUE SHIELD | Admitting: Family Medicine

## 2017-03-08 ENCOUNTER — Ambulatory Visit (INDEPENDENT_AMBULATORY_CARE_PROVIDER_SITE_OTHER): Payer: BLUE CROSS/BLUE SHIELD | Admitting: Family Medicine

## 2017-03-08 VITALS — BP 120/70 | HR 78 | Temp 97.9°F | Wt 158.0 lb

## 2017-03-08 DIAGNOSIS — L03011 Cellulitis of right finger: Secondary | ICD-10-CM

## 2017-03-08 MED ORDER — DOXYCYCLINE HYCLATE 100 MG PO TABS: 100.0000 mg | ORAL_TABLET | Freq: Two times a day (BID) | ORAL | 0 refills | Status: DC

## 2017-03-08 NOTE — Assessment & Plan Note (Signed)
Acute paronychia, no obvious pus pocket so no I&D performed today. Treat with doxy course, warm water soaks and warm compresses. Reviewed anticipated course of resolution. Update if not improving with treatment. Pt agrees with plan.

## 2017-03-08 NOTE — Progress Notes (Signed)
   BP 120/70 (BP Location: Left Arm, Patient Position: Sitting, Cuff Size: Normal)   Pulse 78   Temp 97.9 F (36.6 C) (Oral)   Wt 158 lb (71.7 kg)   SpO2 97%   BMI 24.02 kg/m    CC: edema Subjective:    Patient ID: Samuel Chapman, male    DOB: 06-20-1960, 56 y.o.   MRN: [id number]  HPI: Samuel Chapman is a 56 y.o. male presenting on 03/08/2017 for Edema (on right 5th finger. Also, tender to the touch. Started 2 days ago. Thinks it may be fungal infection)   3d h/o R pinky swelling at DIP, painful and warm.  No fevers/chills, nausea.   Relevant past medical, surgical, family and social history reviewed and updated as indicated. Interim medical history since our last visit reviewed. Allergies and medications reviewed and updated. Outpatient Medications Prior to Visit  Medication Sig Dispense Refill  . loratadine (CLARITIN) 10 MG tablet Take 10 mg by mouth daily.    . mometasone (NASONEX) 50 MCG/ACT nasal spray Place 2 sprays into the nose daily. 17 g 3  . Multiple Vitamin (MULTIVITAMIN) tablet Take 1 tablet by mouth daily.    Marland Kitchen. omeprazole (PRILOSEC) 40 MG capsule TAKE ONE CAPSULE BY MOUTH EVERY DAY FOR 3 WEEKS THEN AS NEEDED 30 capsule 3   No facility-administered medications prior to visit.      Per HPI unless specifically indicated in ROS section below Review of Systems     Objective:    BP 120/70 (BP Location: Left Arm, Patient Position: Sitting, Cuff Size: Normal)   Pulse 78   Temp 97.9 F (36.6 C) (Oral)   Wt 158 lb (71.7 kg)   SpO2 97%   BMI 24.02 kg/m   Wt Readings from Last 3 Encounters:  03/08/17 158 lb (71.7 kg)  12/31/16 155 lb 8 oz (70.5 kg)  01/27/16 158 lb 4 oz (71.8 kg)    Physical Exam  Constitutional: He appears well-developed and well-nourished. No distress.  Musculoskeletal: He exhibits no edema.  Skin: Skin is warm and dry. No rash noted. There is erythema.  R medial 5th digit at nailbed erythema and swelling without fluctuance  Nursing note and  vitals reviewed.     Assessment & Plan:   Problem List Items Addressed This Visit    Paronychia, acute, finger, right - Primary    Acute paronychia, no obvious pus pocket so no I&D performed today. Treat with doxy course, warm water soaks and warm compresses. Reviewed anticipated course of resolution. Update if not improving with treatment. Pt agrees with plan.           Follow up plan: Return if symptoms worsen or fail to improve.  Eustaquio BoydenJavier Raeden Belzer, MD

## 2017-03-08 NOTE — Patient Instructions (Signed)
Paronychia Paronychia is an infection of the skin that surrounds a nail. It usually affects the skin around a fingernail, but it may also occur near a toenail. It often causes pain and swelling around the nail. This condition may come on suddenly or develop over a longer period. In some cases, a collection of pus (abscess) can form near or under the nail. Usually, paronychia is not serious and it clears up with treatment. What are the causes? This condition may be caused by bacteria or fungi. It is commonly caused by either Streptococcus or Staphylococcus bacteria. The bacteria or fungi often cause the infection by getting into the affected area through an opening in the skin, such as a cut or a hangnail. What increases the risk? This condition is more likely to develop in:  People who get their hands wet often, such as those who work as dishwashers, bartenders, or nurses.  People who bite their fingernails or suck their thumbs.  People who trim their nails too short.  People who have hangnails or injured fingertips.  People who get manicures.  People who have diabetes.  What are the signs or symptoms? Symptoms of this condition include:  Redness and swelling of the skin near the nail.  Tenderness around the nail when you touch the area.  Pus-filled bumps under the cuticle. The cuticle is the skin at the base or sides of the nail.  Fluid or pus under the nail.  Throbbing pain in the area.  How is this diagnosed? This condition is usually diagnosed with a physical exam. In some cases, a sample of pus may be taken from an abscess to be tested in a lab. This can help to determine what type of bacteria or fungi is causing the condition. How is this treated? Treatment for this condition depends on the cause and severity of the condition. If the condition is mild, it may clear up on its own in a few days. Your health care provider may recommend soaking the affected area in warm water a  few times a day. When treatment is needed, the options may include:  Antibiotic medicine, if the condition is caused by a bacterial infection.  Antifungal medicine, if the condition is caused by a fungal infection.  Incision and drainage, if an abscess is present. In this procedure, the health care provider will cut open the abscess so the pus can drain out.  Follow these instructions at home:  Soak the affected area in warm water if directed to do so by your health care provider. You may be told to do this for 20 minutes, 2-3 times a day. Keep the area dry in between soakings.  Take medicines only as directed by your health care provider.  If you were prescribed an antibiotic medicine, finish all of it even if you start to feel better.  Keep the affected area clean.  Do not try to drain a fluid-filled bump yourself.  If you will be washing dishes or performing other tasks that require your hands to get wet, wear rubber gloves. You should also wear gloves if your hands might come in contact with irritating substances, such as cleaners or chemicals.  Follow your health care provider's instructions about: ? Wound care. ? Bandage (dressing) changes and removal. Contact a health care provider if:  Your symptoms get worse or do not improve with treatment.  You have a fever or chills.  You have redness spreading from the affected area.  You have continued   or increased fluid, blood, or pus coming from the affected area.  Your finger or knuckle becomes swollen or is difficult to move. This information is not intended to replace advice given to you by your health care provider. Make sure you discuss any questions you have with your health care provider. Document Released: 10/24/2000 Document Revised: 10/06/2015 Document Reviewed: 04/07/2014 Elsevier Interactive Patient Education  2018 [employer]vier Inc.  

## 2017-03-27 ENCOUNTER — Encounter: Payer: Self-pay | Admitting: Family Medicine

## 2017-03-27 ENCOUNTER — Ambulatory Visit: Payer: BLUE CROSS/BLUE SHIELD | Admitting: Family Medicine

## 2017-03-27 VITALS — BP 116/72 | HR 84 | Temp 98.1°F | Wt 156.5 lb

## 2017-03-27 DIAGNOSIS — J069 Acute upper respiratory infection, unspecified: Secondary | ICD-10-CM

## 2017-03-27 DIAGNOSIS — B9789 Other viral agents as the cause of diseases classified elsewhere: Secondary | ICD-10-CM

## 2017-03-27 DIAGNOSIS — J3089 Other allergic rhinitis: Secondary | ICD-10-CM

## 2017-03-27 MED ORDER — MOMETASONE FUROATE 50 MCG/ACT NA SUSP: 2.0000 | Freq: Every day | NASAL | 3 refills | Status: DC

## 2017-03-27 NOTE — Patient Instructions (Signed)
Please resume your nasal spray  Stop Zyrtec- D and take regular sudafed (generic is fine) instead  Increase fluids  Can add afrin nasal spray for maximum of 4 days  PLease let us know if not better in 5-7 days  Delsym for cough

## 2017-03-27 NOTE — Progress Notes (Signed)
Subjective:    Patient ID: Samuel Chapman, male    DOB: 1961/01/26, 56 y.o.   MRN: [id number]  HPI This is a 56 yo male who presents today with 4 days of fatigue, scratchy throat, head congestion. Feels a little better today. Cough with some green phlegm. No fever. No wheeze or SOB. Taking Zyrtec D, does not take loratadine or nasonex. Takes in spring when allergies are worse. Almost out of nasonex.   Past Medical History:  Diagnosis Date  . GERD (gastroesophageal reflux disease)   . History of chicken pox   . Internal hemorrhoid   . RAD (reactive airway disease)    ?of this vs mild asthma  . Seasonal allergic rhinitis    tree pollen  . Zenker's hypopharyngeal diverticulum 04/2015   Past Surgical History:  Procedure Laterality Date  . COLONOSCOPY  2010   int hem, rpt 10 yrs [name]([place])  . ESOPHAGOGASTRODUODENOSCOPY  2016   mod zenker's diverticulum, referred to tertiary center [name]([place])  . HERNIA REPAIR  1963, 1966  . ZENKER'S DIVERTICULECTOMY ENDOSCOPIC  08/2015   [facility]   Family History  Problem Relation Age of Onset  . Lung cancer Mother 7558       lung (smoker)  . Ulcers Father 6070       s/p gastrectomy, stomach hemorrhage  . Stroke Father 55       mild   . CAD Maternal Grandfather   . Diabetes Brother        borderline  . Colon cancer Neg Hx   . Stomach cancer Neg Hx    Social History   Tobacco Use  . Smoking status: Never Smoker  . Smokeless tobacco: Never Used  Substance Use Topics  . Alcohol use: Yes    Alcohol/week: 12.6 oz    Types: 21 Standard drinks or equivalent per week    Comment: 3 glasses wine /day  . Drug use: No      Review of Systems Per HPI    Objective:   Physical Exam  Constitutional: He is oriented to person, place, and time. He appears well-developed and well-nourished. No distress.  HENT:  Head: Normocephalic and atraumatic.  Right Ear: Tympanic membrane, external ear and ear canal normal.  Left Ear: Tympanic membrane,  external ear and ear canal normal.  Nose: Mucosal edema and rhinorrhea present.  Mouth/Throat: Uvula is midline, oropharynx is clear and moist and mucous membranes are normal.  Eyes: Conjunctivae are normal.  Neck: Normal range of motion. Neck supple.  Cardiovascular: Normal rate, regular rhythm and normal heart sounds.  Pulmonary/Chest: Effort normal and breath sounds normal.  Lymphadenopathy:    He has no cervical adenopathy.  Neurological: He is alert and oriented to person, place, and time.  Skin: Skin is warm and dry. He is not diaphoretic.  Psychiatric: He has a normal mood and affect. His behavior is normal. Judgment and thought content normal.  Vitals reviewed.     BP 116/72 (BP Location: Left Arm, Patient Position: Sitting, Cuff Size: Normal)   Pulse 84   Temp 98.1 F (36.7 C) (Oral)   Wt 156 lb 8 oz (71 kg)   SpO2 97%   BMI 23.80 kg/m  Wt Readings from Last 3 Encounters:  03/27/17 156 lb 8 oz (71 kg)  03/08/17 158 lb (71.7 kg)  12/31/16 155 lb 8 oz (70.5 kg)       Assessment & Plan:  1. Viral URI with cough -  Patient Instructions  Please resume your nasal spray  Stop Zyrtec- D and take regular sudafed (generic is fine) instead  Increase fluids  Can add afrin nasal spray for maximum of 4 days  PLease let us know if not better in 5-7 days  Delsym for cough   2. Non-seasonal allergic rhinitis, unspecified trigger - mometasone (NASONEX) 50 MCG/ACT nasal spray; Place 2 sprays daily into the nose.  Dispense: 17 g; Refill: 3   Olean Reeeborah Gessner, FNP-BC  [facility], [facility]  03/30/2017 1:14 PM

## 2017-04-12 ENCOUNTER — Ambulatory Visit: Payer: BLUE CROSS/BLUE SHIELD | Admitting: Family Medicine

## 2017-04-12 ENCOUNTER — Encounter: Payer: Self-pay | Admitting: Family Medicine

## 2017-04-12 VITALS — BP 112/76 | HR 75 | Temp 98.3°F | Wt 155.8 lb

## 2017-04-12 DIAGNOSIS — J0110 Acute frontal sinusitis, unspecified: Secondary | ICD-10-CM | POA: Diagnosis not present

## 2017-04-12 MED ORDER — AMOXICILLIN-POT CLAVULANATE 875-125 MG PO TABS: 1.0000 | ORAL_TABLET | Freq: Two times a day (BID) | ORAL | 0 refills | Status: DC

## 2017-04-12 NOTE — Progress Notes (Signed)
Subjective:    Patient ID: Samuel Chapman, male    DOB: 03/23/1961, 56 y.o.   MRN: [id number]  HPI This is a 56 yo male, seen by me 03/27/17 with sinus congestion. He got better and then has gotten worse.  Today he reports feeling increased congestion with brown and green sputum and nasal drainage. Felt hot last night. No SOB/wheeze, feels fatigued. [place].    Past Medical History:  Diagnosis Date  . GERD (gastroesophageal reflux disease)   . History of chicken pox   . Internal hemorrhoid   . RAD (reactive airway disease)    ?of this vs mild asthma  . Seasonal allergic rhinitis    tree pollen  . Zenker's hypopharyngeal diverticulum 04/2015   Past Surgical History:  Procedure Laterality Date  . COLONOSCOPY  2010   int hem, rpt 10 yrs [name]([place])  . ESOPHAGOGASTRODUODENOSCOPY  2016   mod zenker's diverticulum, referred to tertiary center [name]([place])  . HERNIA REPAIR  1963, 1966  . ZENKER'S DIVERTICULECTOMY ENDOSCOPIC  08/2015   [facility]   Family History  Problem Relation Age of Onset  . Lung cancer Mother 6858       lung (smoker)  . Ulcers Father 2770       s/p gastrectomy, stomach hemorrhage  . Stroke Father 55       mild   . CAD Maternal Grandfather   . Diabetes Brother        borderline  . Colon cancer Neg Hx   . Stomach cancer Neg Hx    Social History   Tobacco Use  . Smoking status: Never Smoker  . Smokeless tobacco: Never Used  Substance Use Topics  . Alcohol use: Yes    Alcohol/week: 12.6 oz    Types: 21 Standard drinks or equivalent per week    Comment: 3 glasses wine /day  . Drug use: No      Review of Systems Per HPI    Objective:   Physical Exam  Constitutional: He is oriented to person, place, and time. He appears well-developed and well-nourished. No distress.  HENT:  Head: Normocephalic and atraumatic.  Right Ear: Tympanic membrane, external ear and ear canal normal.  Left Ear: Tympanic membrane,  external ear and ear canal normal.  Nose: Mucosal edema and rhinorrhea present. Right sinus exhibits maxillary sinus tenderness. Right sinus exhibits no frontal sinus tenderness. Left sinus exhibits maxillary sinus tenderness. Left sinus exhibits no frontal sinus tenderness.  Mouth/Throat: Uvula is midline and mucous membranes are normal. Posterior oropharyngeal erythema present. No oropharyngeal exudate or posterior oropharyngeal edema.  Eyes: Conjunctivae are normal.  Neck: Normal range of motion. Neck supple.  Cardiovascular: Normal rate, regular rhythm and normal heart sounds.  Pulmonary/Chest: Effort normal and breath sounds normal.  Lymphadenopathy:    He has no cervical adenopathy.  Neurological: He is alert and oriented to person, place, and time.  Skin: Skin is warm and dry. He is not diaphoretic.  Psychiatric: He has a normal mood and affect. His behavior is normal. Judgment and thought content normal.  Vitals reviewed.     BP 112/76 (BP Location: Left Arm, Patient Position: Sitting, Cuff Size: Normal)   Pulse 75   Temp 98.3 F (36.8 C) (Oral)   Wt 155 lb 12 oz (70.6 kg)   SpO2 97%   BMI 23.68 kg/m  Wt Readings from Last 3 Encounters:  04/12/17 155 lb 12 oz (70.6 kg)  03/27/17 156 lb 8 oz (71 kg)  03/08/17 158 lb (71.7 kg)       Assessment & Plan:  1. Acute non-recurrent frontal sinusitis - Provided written and verbal information regarding diagnosis and treatment. - RTC precautions reviewed - amoxicillin-clavulanate (AUGMENTIN) 875-125 MG tablet; Take 1 tablet by mouth 2 (two) times daily.  Dispense: 14 tablet; Refill: 0   Olean Reeeborah Taletha Twiford, FNP-BC  [facility]  04/14/2017 1:44 PM

## 2017-04-12 NOTE — Patient Instructions (Signed)

## 2017-04-14 ENCOUNTER — Encounter: Payer: Self-pay | Admitting: Family Medicine

## 2017-12-26 ENCOUNTER — Other Ambulatory Visit: Payer: Self-pay | Admitting: Family Medicine

## 2017-12-26 DIAGNOSIS — E785 Hyperlipidemia, unspecified: Secondary | ICD-10-CM

## 2017-12-26 DIAGNOSIS — Z125 Encounter for screening for malignant neoplasm of prostate: Secondary | ICD-10-CM

## 2017-12-27 ENCOUNTER — Other Ambulatory Visit (INDEPENDENT_AMBULATORY_CARE_PROVIDER_SITE_OTHER): Payer: BLUE CROSS/BLUE SHIELD

## 2017-12-27 DIAGNOSIS — Z125 Encounter for screening for malignant neoplasm of prostate: Secondary | ICD-10-CM

## 2017-12-27 DIAGNOSIS — E785 Hyperlipidemia, unspecified: Secondary | ICD-10-CM

## 2017-12-27 LAB — COMPREHENSIVE METABOLIC PANEL
ALT: 23 U/L (ref 0–53)
AST: 23 U/L (ref 0–37)
Albumin: 4.6 g/dL (ref 3.5–5.2)
Alkaline Phosphatase: 55 U/L (ref 39–117)
BUN: 16 mg/dL (ref 6–23)
CO2: 32 mEq/L (ref 19–32)
Calcium: 9.9 mg/dL (ref 8.4–10.5)
Chloride: 102 mEq/L (ref 96–112)
Creatinine, Ser: 1.05 mg/dL (ref 0.40–1.50)
GFR: 76.84 mL/min (ref >60.00)
Glucose, Bld: 94 mg/dL (ref 70–99)
Potassium: 4.6 mEq/L (ref 3.5–5.1)
Sodium: 140 mEq/L (ref 135–145)
Total Bilirubin: 0.6 mg/dL (ref 0.2–1.2)
Total Protein: 6.9 g/dL (ref 6.0–8.3)

## 2017-12-27 LAB — LIPID PANEL
Cholesterol: 231 mg/dL — ABNORMAL HIGH (ref 0–200)
HDL: 80 mg/dL (ref >39.00)
LDL Cholesterol: 126 mg/dL — ABNORMAL HIGH (ref 0–99)
NonHDL: 150.68
Total CHOL/HDL Ratio: 3
Triglycerides: 124 mg/dL (ref 0.0–149.0)
VLDL: 24.8 mg/dL (ref 0.0–40.0)

## 2017-12-27 LAB — PSA: PSA: 0.85 ng/mL (ref 0.10–4.00)

## 2018-01-03 ENCOUNTER — Encounter: Payer: Self-pay | Admitting: Family Medicine

## 2018-01-03 ENCOUNTER — Ambulatory Visit (INDEPENDENT_AMBULATORY_CARE_PROVIDER_SITE_OTHER): Payer: BLUE CROSS/BLUE SHIELD | Admitting: Family Medicine

## 2018-01-03 VITALS — BP 118/80 | HR 75 | Temp 98.2°F | Ht 67.25 in | Wt 154.0 lb

## 2018-01-03 DIAGNOSIS — H9190 Unspecified hearing loss, unspecified ear: Secondary | ICD-10-CM | POA: Diagnosis not present

## 2018-01-03 DIAGNOSIS — Z0000 Encounter for general adult medical examination without abnormal findings: Secondary | ICD-10-CM | POA: Diagnosis not present

## 2018-01-03 DIAGNOSIS — E785 Hyperlipidemia, unspecified: Secondary | ICD-10-CM | POA: Diagnosis not present

## 2018-01-03 DIAGNOSIS — Z9889 Other specified postprocedural states: Secondary | ICD-10-CM | POA: Diagnosis not present

## 2018-01-03 DIAGNOSIS — H9192 Unspecified hearing loss, left ear: Secondary | ICD-10-CM | POA: Insufficient documentation

## 2018-01-03 DIAGNOSIS — Z8719 Personal history of other diseases of the digestive system: Secondary | ICD-10-CM

## 2018-01-03 NOTE — Assessment & Plan Note (Signed)
Preventative protocols reviewed and updated unless pt declined. Discussed healthy diet and lifestyle.  

## 2018-01-03 NOTE — Patient Instructions (Addendum)
Hearing screen today.  If interested, check with pharmacy about new 2 shot shingles series (shingrix).  You are doing well today.  Return as needed or in 1 year for next physical.  Health Maintenance, Male A healthy lifestyle and preventive care is important for your health and wellness. Ask your health care provider about what schedule of regular examinations is right for you. What should I know about weight and diet? Eat a Healthy Diet  Eat plenty of vegetables, fruits, whole grains, low-fat dairy products, and lean protein.  Do not eat a lot of foods high in solid fats, added sugars, or salt.  Maintain a Healthy Weight Regular exercise can help you achieve or maintain a healthy weight. You should:  Do at least 150 minutes of exercise each week. The exercise should increase your heart rate and make you sweat (moderate-intensity exercise).  Do strength-training exercises at least twice a week.  Watch Your Levels of Cholesterol and Blood Lipids  Have your blood tested for lipids and cholesterol every 5 years starting at 59 years of age. If you are at high risk for heart disease, you should start having your blood tested when you are 59 years old. You may need to have your cholesterol levels checked more often if: ? Your lipid or cholesterol levels are high. ? You are older than 59 years of age. ? You are at high risk for heart disease.  What should I know about cancer screening? Many types of cancers can be detected early and may often be prevented. Lung Cancer  You should be screened every year for lung cancer if: ? You are a current smoker who has smoked for at least 30 years. ? You are a former smoker who has quit within the past 15 years.  Talk to your health care provider about your screening options, when you should start screening, and how often you should be screened.  Colorectal Cancer  Routine colorectal cancer screening usually begins at 59 years of age and should  be repeated every 5-10 years until you are 59 years old. You may need to be screened more often if early forms of precancerous polyps or small growths are found. Your health care provider may recommend screening at an earlier age if you have risk factors for colon cancer.  Your health care provider may recommend using home test kits to check for hidden blood in the stool.  A small camera at the end of a tube can be used to examine your colon (sigmoidoscopy or colonoscopy). This checks for the earliest forms of colorectal cancer.  Prostate and Testicular Cancer  Depending on your age and overall health, your health care provider may do certain tests to screen for prostate and testicular cancer.  Talk to your health care provider about any symptoms or concerns you have about testicular or prostate cancer.  Skin Cancer  Check your skin from head to toe regularly.  Tell your health care provider about any new moles or changes in moles, especially if: ? There is a change in a mole's size, shape, or color. ? You have a mole that is larger than a pencil eraser.  Always use sunscreen. Apply sunscreen liberally and repeat throughout the day.  Protect yourself by wearing long sleeves, pants, a wide-brimmed hat, and sunglasses when outside.  What should I know about heart disease, diabetes, and high blood pressure?  If you are 7518-59 years of age, have your blood pressure checked every 3-5 years. If  you are 30 years of age or older, have your blood pressure checked every year. You should have your blood pressure measured twice-once when you are at a hospital or clinic, and once when you are not at a hospital or clinic. Record the average of the two measurements. To check your blood pressure when you are not at a hospital or clinic, you can use: ? An automated blood pressure machine at a pharmacy. ? A home blood pressure monitor.  Talk to your health care provider about your target blood  pressure.  If you are between 56-78 years old, ask your health care provider if you should take aspirin to prevent heart disease.  Have regular diabetes screenings by checking your fasting blood sugar level. ? If you are at a normal weight and have a low risk for diabetes, have this test once every three years after the age of 56. ? If you are overweight and have a high risk for diabetes, consider being tested at a younger age or more often.  A one-time screening for abdominal aortic aneurysm (AAA) by ultrasound is recommended for men aged 65-75 years who are current or former smokers. What should I know about preventing infection? Hepatitis B If you have a higher risk for hepatitis B, you should be screened for this virus. Talk with your health care provider to find out if you are at risk for hepatitis B infection. Hepatitis C Blood testing is recommended for:  Everyone born from [birth year] through 1967.  Anyone with known risk factors for hepatitis C.  Sexually Transmitted Diseases (STDs)  You should be screened each year for STDs including gonorrhea and chlamydia if: ? You are sexually active and are younger than 59 years of age. ? You are older than 59 years of age and your health care provider tells you that you are at risk for this type of infection. ? Your sexual activity has changed since you were last screened and you are at an increased risk for chlamydia or gonorrhea. Ask your health care provider if you are at risk.  Talk with your health care provider about whether you are at high risk of being infected with HIV. Your health care provider may recommend a prescription medicine to help prevent HIV infection.  What else can I do?  Schedule regular health, dental, and eye exams.  Stay current with your vaccines (immunizations).  Do not use any tobacco products, such as cigarettes, chewing tobacco, and e-cigarettes. If you need help quitting, ask your health care  provider.  Limit alcohol intake to no more than 2 drinks per day. One drink equals 12 ounces of beer, 5 ounces of wine, or 1 ounces of hard liquor.  Do not use street drugs.  Do not share needles.  Ask your health care provider for help if you need support or information about quitting drugs.  Tell your health care provider if you often feel depressed.  Tell your health care provider if you have ever been abused or do not feel safe at home. This information is not intended to replace advice given to you by your health care provider. Make sure you discuss any questions you have with your health care provider. Document Released: 10/27/2007 Document Revised: 12/28/2015 Document Reviewed: 02/01/2015 Elsevier Interactive Patient Education  [employer].

## 2018-01-03 NOTE — Progress Notes (Signed)
BP 118/80 (BP Location: Left Arm, Patient Position: Sitting, Cuff Size: Normal)   Pulse 75   Temp 98.2 F (36.8 C) (Oral)   Ht 5' 7.25" (1.708 m)   Wt 154 lb (69.9 kg)   SpO2 98%   BMI 23.94 kg/m    Hearing Screening   125Hz  250Hz  500Hz  1000Hz  2000Hz  3000Hz  4000Hz  6000Hz  8000Hz   Right ear:   25 40 40  0    Left ear:   40 40 40  0      CC: CPE Subjective:    Patient ID: Samuel Chapman, male    DOB: 08-11-1960, 57 y.o.   MRN: [id number]  HPI: Samuel Chapman is a 57 y.o. male presenting on 01/03/2018 for Annual Exam   Zencker's diverticulum s/p diverticulectomy intraluminal approach at [facility] 08/2015. PRN pepcid.  Husband has noted some hearing loss. fmhx hearing loss.   Preventative: COLONOSCOPY Date: 2010 int hem, rpt 10 yrs [name]).  Prostate cancer screening - discussed, requests screening yearly Flu yearly  Tdap 2016 Seat belt use discussed Sunscreen use discussed, no changing moles on skin. Saw derm.  Nonsmoker Alcohol - 2-3 glasses of wine/day. Never >5 drinks.  Dentist regularly Eye exam - due  Lives with husband (Massimo Henderson) Edu: university Occupation: works at [employer]	[place]: gym, works with trainer twice weekly, pilates, walks dog daily  Diet: good water, fruits/vegetables daily   Relevant past medical, surgical, family and social history reviewed and updated as indicated. Interim medical history since our last visit reviewed. Allergies and medications reviewed and updated. Outpatient Medications Prior to Visit  Medication Sig Dispense Refill  . famotidine (PEPCID) 20 MG tablet Take 20 mg by mouth 2 (two) times daily as needed for heartburn or indigestion.    Marland Kitchen loratadine (CLARITIN) 10 MG tablet Take 10 mg by mouth daily as needed. As needed for seasonal allergies    . mometasone (NASONEX) 50 MCG/ACT nasal spray Place 2 sprays daily into the nose. (Patient taking differently: Place 2 sprays into the nose daily as needed. As needed for seasonal  allergies) 17 g 3  . Multiple Vitamin (MULTIVITAMIN) tablet Take 1 tablet by mouth daily.    Marland Kitchen amoxicillin-clavulanate (AUGMENTIN) 875-125 MG tablet Take 1 tablet by mouth 2 (two) times daily. 14 tablet 0  . omeprazole (PRILOSEC) 40 MG capsule TAKE ONE CAPSULE BY MOUTH EVERY DAY FOR 3 WEEKS THEN AS NEEDED (Patient taking differently: TAKE ONE CAPSULE BY MOUTH ONCE DAILY AS NEEDED) 30 capsule 3   No facility-administered medications prior to visit.      Per HPI unless specifically indicated in ROS section below Review of Systems  Constitutional: Negative for activity change, appetite change, chills, fatigue, fever and unexpected weight change.  HENT: Negative for hearing loss.   Eyes: Negative for visual disturbance.  Respiratory: Positive for cough (related to GERD/zenckers). Negative for chest tightness, shortness of breath and wheezing.   Cardiovascular: Negative for chest pain, palpitations and leg swelling.  Gastrointestinal: Negative for abdominal distention, abdominal pain, blood in stool, constipation, diarrhea, nausea and vomiting.  Genitourinary: Negative for difficulty urinating and hematuria.  Musculoskeletal: Negative for arthralgias, myalgias and neck pain.  Skin: Negative for rash.  Neurological: Negative for dizziness, seizures, syncope and headaches.  Hematological: Negative for adenopathy. Does not bruise/bleed easily.  Psychiatric/Behavioral: Negative for dysphoric mood. The patient is not nervous/anxious.        Objective:    BP 118/80 (BP Location: Left Arm, Patient Position: Sitting, Cuff Size: Normal)  Pulse 75   Temp 98.2 F (36.8 C) (Oral)   Ht 5' 7.25" (1.708 m)   Wt 154 lb (69.9 kg)   SpO2 98%   BMI 23.94 kg/m   Wt Readings from Last 3 Encounters:  01/03/18 154 lb (69.9 kg)  04/12/17 155 lb 12 oz (70.6 kg)  03/27/17 156 lb 8 oz (71 kg)    Physical Exam  Constitutional: He is oriented to person, place, and time. He appears well-developed and  well-nourished. No distress.  HENT:  Head: Normocephalic and atraumatic.  Right Ear: Hearing, tympanic membrane, external ear and ear canal normal.  Left Ear: Hearing, tympanic membrane, external ear and ear canal normal.  Nose: Nose normal.  Mouth/Throat: Uvula is midline, oropharynx is clear and moist and mucous membranes are normal. No oropharyngeal exudate, posterior oropharyngeal edema or posterior oropharyngeal erythema.  Eyes: Pupils are equal, round, and reactive to light. Conjunctivae and EOM are normal. No scleral icterus.  Neck: Normal range of motion. Neck supple. No thyromegaly present.  Cardiovascular: Normal rate, regular rhythm, normal heart sounds and intact distal pulses.  No murmur heard. Pulses:      Radial pulses are 2+ on the right side, and 2+ on the left side.  Pulmonary/Chest: Effort normal and breath sounds normal. No respiratory distress. He has no wheezes. He has no rales.  Abdominal: Soft. Bowel sounds are normal. He exhibits no distension and no mass. There is no tenderness. There is no rebound and no guarding.  Genitourinary: Rectum normal and prostate normal. Rectal exam shows no external hemorrhoid, no internal hemorrhoid, no fissure, no mass, no tenderness and anal tone normal. Prostate is not enlarged (15gm) and not tender.  Musculoskeletal: Normal range of motion. He exhibits no edema.  Lymphadenopathy:    He has no cervical adenopathy.  Neurological: He is alert and oriented to person, place, and time.  CN grossly intact, station and gait intact  Skin: Skin is warm and dry. No rash noted.  Psychiatric: He has a normal mood and affect. His behavior is normal. Judgment and thought content normal.  Nursing note and vitals reviewed.  Results for orders placed or performed in visit on 12/27/17  PSA  Result Value Ref Range   PSA 0.85 0.10 - 4.00 ng/mL  Lipid panel  Result Value Ref Range   Cholesterol 231 (H) 0 - 200 mg/dL   Triglycerides 161.0 0.0 -  149.0 mg/dL   HDL 96.04 >54.09 mg/dL   VLDL 81.1 0.0 - 91.4 mg/dL   LDL Cholesterol 782 (H) 0 - 99 mg/dL   Total CHOL/HDL Ratio 3    NonHDL 150.68   Comprehensive metabolic panel  Result Value Ref Range   Sodium 140 135 - 145 mEq/L   Potassium 4.6 3.5 - 5.1 mEq/L   Chloride 102 96 - 112 mEq/L   CO2 32 19 - 32 mEq/L   Glucose, Bld 94 70 - 99 mg/dL   BUN 16 6 - 23 mg/dL   Creatinine, Ser 9.56 0.40 - 1.50 mg/dL   Total Bilirubin 0.6 0.2 - 1.2 mg/dL   Alkaline Phosphatase 55 39 - 117 U/L   AST 23 0 - 37 U/L   ALT 23 0 - 53 U/L   Total Protein 6.9 6.0 - 8.3 g/dL   Albumin 4.6 3.5 - 5.2 g/dL   Calcium 9.9 8.4 - 21.3 mg/dL   GFR 08.65 >78.46 mL/min      Assessment & Plan:   Problem List Items Addressed This Visit  HLD (hyperlipidemia)    Chronic stable off statin. High HDL. The 10-year ASCVD risk score Denman George(Goff DC Montez HagemanJr., et al., 2013) is: 5.6%   Values used to calculate the score:     Age: [age] years     Sex: Male     Is Non-Hispanic African American: No     Diabetic: No     Tobacco smoker: No     Systolic Blood Pressure: 118 mmHg     Is BP treated: No     HDL Cholesterol: 80 mg/dL     Total Cholesterol: 231 mg/dL       History of excision of Zenker's diverticulum    Continue PRN pepcid for intermittent dysphagia and GERD      Hearing loss    Check hearing screen today. Will continue to monitor.        Health maintenance examination - Primary    Preventative protocols reviewed and updated unless pt declined. Discussed healthy diet and lifestyle.           No orders of the defined types were placed in this encounter.  No orders of the defined types were placed in this encounter.   Follow up plan: Return in about 1 year (around 01/04/2019) for annual exam, prior fasting for blood work.  Eustaquio BoydenJavier Harli Engelken, MD

## 2018-01-03 NOTE — Assessment & Plan Note (Signed)
Continue PRN pepcid for intermittent dysphagia and GERD

## 2018-01-03 NOTE — Assessment & Plan Note (Signed)
Chronic stable off statin. High HDL. The 10-year ASCVD risk score Samuel George(Goff DC Montez HagemanJr., et al., 2013) is: 5.6%   Values used to calculate the score:     Age: 57 years     Sex: Male     Is Non-Hispanic African American: No     Diabetic: No     Tobacco smoker: No     Systolic Blood Pressure: 118 mmHg     Is BP treated: No     HDL Cholesterol: 80 mg/dL     Total Cholesterol: 231 mg/dL

## 2018-01-03 NOTE — Assessment & Plan Note (Addendum)
Check hearing screen today. Will continue to monitor.

## 2018-05-22 ENCOUNTER — Other Ambulatory Visit: Payer: Self-pay

## 2018-05-22 ENCOUNTER — Ambulatory Visit (INDEPENDENT_AMBULATORY_CARE_PROVIDER_SITE_OTHER): Payer: BLUE CROSS/BLUE SHIELD | Admitting: Family Medicine

## 2018-05-22 ENCOUNTER — Encounter: Payer: Self-pay | Admitting: Family Medicine

## 2018-05-22 VITALS — BP 120/68 | HR 93 | Temp 100.3°F | Ht 67.25 in | Wt 154.2 lb

## 2018-05-22 DIAGNOSIS — J101 Influenza due to other identified influenza virus with other respiratory manifestations: Secondary | ICD-10-CM | POA: Insufficient documentation

## 2018-05-22 DIAGNOSIS — R52 Pain, unspecified: Secondary | ICD-10-CM

## 2018-05-22 HISTORY — DX: Influenza due to other identified influenza virus with other respiratory manifestations: J10.1

## 2018-05-22 LAB — POC INFLUENZA A&B (BINAX/QUICKVUE)
Influenza A, POC: POSITIVE — AB
Influenza B, POC: NEGATIVE

## 2018-05-22 MED ORDER — OSELTAMIVIR PHOSPHATE 75 MG PO CAPS: 75.0000 mg | ORAL_CAPSULE | Freq: Two times a day (BID) | ORAL | 0 refills | Status: DC

## 2018-05-22 NOTE — Assessment & Plan Note (Signed)
Flu swab positive Treat with tamiflu Further supportive care reviewed rec tylenol for body aches.  Update if not improving with treatment.

## 2018-05-22 NOTE — Patient Instructions (Signed)
Flu swab positive for A. Push fluids and rest. Tylenol for body aches.  Take tamiflu twice daily for 5 days Tamiflu sent for Massimo to start as well.   Influenza, Adult Influenza, more commonly known as "the flu," is a viral infection that mainly affects the respiratory tract. The respiratory tract includes organs that help you breathe, such as the lungs, nose, and throat. The flu causes many symptoms similar to the common cold along with high fever and body aches. The flu spreads easily from person to person (is contagious). Getting a flu shot (influenza vaccination) every year is the best way to prevent the flu. What are the causes? This condition is caused by the influenza virus. You can get the virus by:  Breathing in droplets that are in the air from an infected person's cough or sneeze.  Touching something that has been exposed to the virus (has been contaminated) and then touching your mouth, nose, or eyes. What increases the risk? The following factors may make you more likely to get the flu:  Not washing or sanitizing your hands often.  Having close contact with many people during cold and flu season.  Touching your mouth, eyes, or nose without first washing or sanitizing your hands.  Not getting a yearly (annual) flu shot. You may have a higher risk for the flu, including serious problems such as a lung infection (pneumonia), if you:  Are older than 65.  Are pregnant.  Have a weakened disease-fighting system (immune system). You may have a weakened immune system if you: ? Have HIV or AIDS. ? Are undergoing chemotherapy. ? Are taking medicines that reduce (suppress) the activity of your immune system.  Have a long-term (chronic) illness, such as heart disease, kidney disease, diabetes, or lung disease.  Have a liver disorder.  Are severely overweight (morbidly obese).  Have anemia. This is a condition that affects your red blood cells.  Have asthma. What are the  signs or symptoms? Symptoms of this condition usually begin suddenly and last 4-14 days. They may include:  Fever and chills.  Headaches, body aches, or muscle aches.  Sore throat.  Cough.  Runny or stuffy (congested) nose.  Chest discomfort.  Poor appetite.  Weakness or fatigue.  Dizziness.  Nausea or vomiting. How is this diagnosed? This condition may be diagnosed based on:  Your symptoms and medical history.  A physical exam.  Swabbing your nose or throat and testing the fluid for the influenza virus. How is this treated? If the flu is diagnosed early, you can be treated with medicine that can help reduce how severe the illness is and how long it lasts (antiviral medicine). This may be given by mouth (orally) or through an IV. Taking care of yourself at home can help relieve symptoms. Your health care provider may recommend:  Taking over-the-counter medicines.  Drinking plenty of fluids. In many cases, the flu goes away on its own. If you have severe symptoms or complications, you may be treated in a hospital. Follow these instructions at home: Activity  Rest as needed and get plenty of sleep.  Stay home from work or school as told by your health care provider. Unless you are visiting your health care provider, avoid leaving home until your fever has been gone for 24 hours without taking medicine. Eating and drinking  Take an oral rehydration solution (ORS). This is a drink that is sold at pharmacies and retail stores.  Drink enough fluid to keep your urine  pale yellow.  Drink clear fluids in small amounts as you are able. Clear fluids include water, ice chips, diluted fruit juice, and low-calorie sports drinks.  Eat bland, easy-to-digest foods in small amounts as you are able. These foods include bananas, applesauce, rice, lean meats, toast, and crackers.  Avoid drinking fluids that contain a lot of sugar or caffeine, such as energy drinks, regular sports  drinks, and soda.  Avoid alcohol.  Avoid spicy or fatty foods. General instructions      Take over-the-counter and prescription medicines only as told by your health care provider.  Use a cool mist humidifier to add humidity to the air in your home. This can make it easier to breathe.  Cover your mouth and nose when you cough or sneeze.  Wash your hands with soap and water often, especially after you cough or sneeze. If soap and water are not available, use alcohol-based hand sanitizer.  Keep all follow-up visits as told by your health care provider. This is important. How is this prevented?   Get an annual flu shot. You may get the flu shot in late summer, fall, or winter. Ask your health care provider when you should get your flu shot.  Avoid contact with people who are sick during cold and flu season. This is generally fall and winter. Contact a health care provider if:  You develop new symptoms.  You have: ? Chest pain. ? Diarrhea. ? A fever.  Your cough gets worse.  You produce more mucus.  You feel nauseous or you vomit. Get help right away if:  You develop shortness of breath or difficulty breathing.  Your skin or nails turn a bluish color.  You have severe pain or stiffness in your neck.  You develop a sudden headache or sudden pain in your face or ear.  You cannot eat or drink without vomiting. Summary  Influenza, more commonly known as "the flu," is a viral infection that primarily affects your respiratory tract.  Symptoms of the flu usually begin suddenly and last 4-14 days.  Getting an annual flu shot is the best way to prevent getting the flu.  Stay home from work or school as told by your health care provider. Unless you are visiting your health care provider, avoid leaving home until your fever has been gone for 24 hours without taking medicine.  Keep all follow-up visits as told by your health care provider. This is important. This  information is not intended to replace advice given to you by your health care provider. Make sure you discuss any questions you have with your health care provider. Document Released: 04/27/2000 Document Revised: 10/16/2017 Document Reviewed: 10/16/2017 Elsevier Interactive Patient Education  2019 [employer]

## 2018-05-22 NOTE — Progress Notes (Signed)
BP 120/68 (BP Location: Left Arm, Patient Position: Sitting, Cuff Size: Normal)   Pulse 93   Temp 100.3 F (37.9 C) (Oral)   Ht 5' 7.25" (1.708 m)   Wt 154 lb 4 oz (70 kg)   SpO2 95%   BMI 23.98 kg/m    CC: body aches Subjective:    Patient ID: Samuel Chapman, male    DOB: [birth year], 58 y.o.   MRN: [id number]  HPI: Ryusei Mays is a 58 y.o. male presenting on 05/22/2018 for Generalized Body Aches (C/o body aches, cough, loss of appetite, fatigue, HA and sinus pressure. Denies any n/v/d or fever. Sxs started 05/19/18. Tried Nyquil, helpful with cough and sleep. )   3 d h/o body aches, chest congestion, cough, marked fatigue, no appetite. Feverish, chills. Starting to get sinus pressure/congestion. Sudden onset illness.   Recent return from [place] on Saturday  Taking nyquil at night time.  Husband sick at home Non smoker No h/o asthma.      Relevant past medical, surgical, family and social history reviewed and updated as indicated. Interim medical history since our last visit reviewed. Allergies and medications reviewed and updated. Outpatient Medications Prior to Visit  Medication Sig Dispense Refill  . Cholecalciferol (VITAMIN D3 PO) Take 1 capsule by mouth daily.    . famotidine (PEPCID) 20 MG tablet Take 20 mg by mouth 2 (two) times daily as needed for heartburn or indigestion.    Marland Kitchen loratadine (CLARITIN) 10 MG tablet Take 10 mg by mouth daily as needed. As needed for seasonal allergies    . mometasone (NASONEX) 50 MCG/ACT nasal spray Place 2 sprays daily into the nose. (Patient taking differently: Place 2 sprays into the nose daily as needed. As needed for seasonal allergies) 17 g 3  . Multiple Vitamin (MULTIVITAMIN) tablet Take 1 tablet by mouth daily.     No facility-administered medications prior to visit.      Per HPI unless specifically indicated in ROS section below Review of Systems Objective:    BP 120/68 (BP Location: Left Arm, Patient Position: Sitting, Cuff  Size: Normal)   Pulse 93   Temp 100.3 F (37.9 C) (Oral)   Ht 5' 7.25" (1.708 m)   Wt 154 lb 4 oz (70 kg)   SpO2 95%   BMI 23.98 kg/m   Wt Readings from Last 3 Encounters:  05/22/18 154 lb 4 oz (70 kg)  01/03/18 154 lb (69.9 kg)  04/12/17 155 lb 12 oz (70.6 kg)    Physical Exam Vitals signs and nursing note reviewed.  Constitutional:      General: He is not in acute distress.    Appearance: Normal appearance. He is well-developed.  HENT:     Head: Normocephalic and atraumatic.     Right Ear: Hearing, ear canal and external ear normal.     Left Ear: Hearing, ear canal and external ear normal.     Ears:     Comments: Cerumen in canals    Nose: Mucosal edema (nasal mucosal congestion) present. No rhinorrhea.     Right Sinus: No maxillary sinus tenderness or frontal sinus tenderness.     Left Sinus: No maxillary sinus tenderness or frontal sinus tenderness.     Mouth/Throat:     Pharynx: Oropharynx is clear. Uvula midline. No oropharyngeal exudate or posterior oropharyngeal erythema.     Tonsils: No tonsillar abscesses.  Eyes:     General: No scleral icterus.    Conjunctiva/sclera: Conjunctivae normal.  Pupils: Pupils are equal, round, and reactive to light.  Neck:     Musculoskeletal: Normal range of motion and neck supple.  Cardiovascular:     Rate and Rhythm: Normal rate and regular rhythm.     Heart sounds: Normal heart sounds. No murmur.  Pulmonary:     Effort: Pulmonary effort is normal. No respiratory distress.     Breath sounds: Normal breath sounds. No wheezing or rales.     Comments: Lungs clear Lymphadenopathy:     Cervical: No cervical adenopathy.  Skin:    General: Skin is warm and dry.     Findings: No rash.  Neurological:     Mental Status: He is alert.       Results for orders placed or performed in visit on 05/22/18  POC Influenza A&B(BINAX/QUICKVUE)  Result Value Ref Range   Influenza A, POC Positive (A) Negative   Influenza B, POC Negative  Negative   Assessment & Plan:  tamiflu sent in for husband Massimo as well.  Problem List Items Addressed This Visit    Influenza A - Primary    Flu swab positive Treat with tamiflu Further supportive care reviewed rec tylenol for body aches.  Update if not improving with treatment.       Relevant Medications   oseltamivir (TAMIFLU) 75 MG capsule    Other Visit Diagnoses    Generalized body aches       Relevant Orders   POC Influenza A&B(BINAX/QUICKVUE) (Completed)       Meds ordered this encounter  Medications  . oseltamivir (TAMIFLU) 75 MG capsule    Sig: Take 1 capsule (75 mg total) by mouth 2 (two) times daily.    Dispense:  10 capsule    Refill:  0   Orders Placed This Encounter  Procedures  . POC Influenza A&B(BINAX/QUICKVUE)    Follow up plan: Return if symptoms worsen or fail to improve.  Eustaquio Boyden, MD

## 2019-01-05 ENCOUNTER — Encounter: Payer: Self-pay | Admitting: Family Medicine

## 2019-01-05 ENCOUNTER — Other Ambulatory Visit: Payer: Self-pay | Admitting: Family Medicine

## 2019-01-05 DIAGNOSIS — E785 Hyperlipidemia, unspecified: Secondary | ICD-10-CM

## 2019-01-05 DIAGNOSIS — Z125 Encounter for screening for malignant neoplasm of prostate: Secondary | ICD-10-CM

## 2019-01-06 ENCOUNTER — Other Ambulatory Visit: Payer: BLUE CROSS/BLUE SHIELD

## 2019-01-09 ENCOUNTER — Encounter: Payer: BLUE CROSS/BLUE SHIELD | Admitting: Family Medicine

## 2019-01-19 ENCOUNTER — Encounter: Payer: Self-pay | Admitting: Internal Medicine

## 2019-02-19 ENCOUNTER — Encounter: Payer: Self-pay | Admitting: Family Medicine

## 2019-02-23 ENCOUNTER — Other Ambulatory Visit (INDEPENDENT_AMBULATORY_CARE_PROVIDER_SITE_OTHER): Payer: BC Managed Care – PPO

## 2019-02-23 ENCOUNTER — Encounter: Payer: BLUE CROSS/BLUE SHIELD | Admitting: Family Medicine

## 2019-02-23 ENCOUNTER — Other Ambulatory Visit: Payer: Self-pay

## 2019-02-23 DIAGNOSIS — Z125 Encounter for screening for malignant neoplasm of prostate: Secondary | ICD-10-CM | POA: Diagnosis not present

## 2019-02-23 DIAGNOSIS — E785 Hyperlipidemia, unspecified: Secondary | ICD-10-CM

## 2019-02-23 LAB — COMPREHENSIVE METABOLIC PANEL
ALT: 25 U/L (ref 0–53)
AST: 24 U/L (ref 0–37)
Albumin: 4.8 g/dL (ref 3.5–5.2)
Alkaline Phosphatase: 64 U/L (ref 39–117)
BUN: 13 mg/dL (ref 6–23)
CO2: 31 mEq/L (ref 19–32)
Calcium: 9.9 mg/dL (ref 8.4–10.5)
Chloride: 103 mEq/L (ref 96–112)
Creatinine, Ser: 1 mg/dL (ref 0.40–1.50)
GFR: 76.18 mL/min (ref >60.00)
Glucose, Bld: 88 mg/dL (ref 70–99)
Potassium: 4.7 mEq/L (ref 3.5–5.1)
Sodium: 141 mEq/L (ref 135–145)
Total Bilirubin: 0.4 mg/dL (ref 0.2–1.2)
Total Protein: 7.2 g/dL (ref 6.0–8.3)

## 2019-02-23 LAB — PSA: PSA: 0.95 ng/mL (ref 0.10–4.00)

## 2019-02-23 LAB — LIPID PANEL
Cholesterol: 249 mg/dL — ABNORMAL HIGH (ref 0–200)
HDL: 78.3 mg/dL (ref >39.00)
LDL Cholesterol: 154 mg/dL — ABNORMAL HIGH (ref 0–99)
NonHDL: 171.18
Total CHOL/HDL Ratio: 3
Triglycerides: 85 mg/dL (ref 0.0–149.0)
VLDL: 17 mg/dL (ref 0.0–40.0)

## 2019-03-02 ENCOUNTER — Ambulatory Visit (INDEPENDENT_AMBULATORY_CARE_PROVIDER_SITE_OTHER): Payer: BC Managed Care – PPO | Admitting: Family Medicine

## 2019-03-02 ENCOUNTER — Encounter: Payer: Self-pay | Admitting: Family Medicine

## 2019-03-02 ENCOUNTER — Other Ambulatory Visit: Payer: Self-pay

## 2019-03-02 VITALS — BP 122/80 | HR 69 | Temp 97.8°F | Ht 67.0 in | Wt 153.3 lb

## 2019-03-02 DIAGNOSIS — H9192 Unspecified hearing loss, left ear: Secondary | ICD-10-CM

## 2019-03-02 DIAGNOSIS — K219 Gastro-esophageal reflux disease without esophagitis: Secondary | ICD-10-CM | POA: Diagnosis not present

## 2019-03-02 DIAGNOSIS — Z23 Encounter for immunization: Secondary | ICD-10-CM | POA: Diagnosis not present

## 2019-03-02 DIAGNOSIS — Z9889 Other specified postprocedural states: Secondary | ICD-10-CM

## 2019-03-02 DIAGNOSIS — Z0000 Encounter for general adult medical examination without abnormal findings: Secondary | ICD-10-CM | POA: Diagnosis not present

## 2019-03-02 DIAGNOSIS — Z8719 Personal history of other diseases of the digestive system: Secondary | ICD-10-CM

## 2019-03-02 DIAGNOSIS — E785 Hyperlipidemia, unspecified: Secondary | ICD-10-CM

## 2019-03-02 NOTE — Assessment & Plan Note (Addendum)
Pt denies significant trouble, husband remains concerned with hearing loss. Irrigation performed today successfully. Hearing test - significant L sided hearing loss. Will refer for formal audiology evaluation

## 2019-03-02 NOTE — Assessment & Plan Note (Signed)
Stable period on PRN pepcid. Continue.

## 2019-03-02 NOTE — Patient Instructions (Addendum)
Flu shot today Shigrix shot at your convenience (2 shot shingles series).  Either get here (nurse visit) or at local pharmacy.  Ear irrigation performed today.  Return as needed or in 1 year for next physical.  Health Maintenance, Male Adopting a healthy lifestyle and getting preventive care are important in promoting health and wellness. Ask your health care provider about:  The right schedule for you to have regular tests and exams.  Things you can do on your own to prevent diseases and keep yourself healthy. What should I know about diet, weight, and exercise? Eat a healthy diet   Eat a diet that includes plenty of vegetables, fruits, low-fat dairy products, and lean protein.  Do not eat a lot of foods that are high in solid fats, added sugars, or sodium. Maintain a healthy weight Body mass index (BMI) is a measurement that can be used to identify possible weight problems. It estimates body fat based on height and weight. Your health care provider can help determine your BMI and help you achieve or maintain a healthy weight. Get regular exercise Get regular exercise. This is one of the most important things you can do for your health. Most adults should:  Exercise for at least 150 minutes each week. The exercise should increase your heart rate and make you sweat (moderate-intensity exercise).  Do strengthening exercises at least twice a week. This is in addition to the moderate-intensity exercise.  Spend less time sitting. Even light physical activity can be beneficial. Watch cholesterol and blood lipids Have your blood tested for lipids and cholesterol at 60 years of age, then have this test every 5 years. You may need to have your cholesterol levels checked more often if:  Your lipid or cholesterol levels are high.  You are older than 60 years of age.  You are at high risk for heart disease. What should I know about cancer screening? Many types of cancers can be detected  early and may often be prevented. Depending on your health history and family history, you may need to have cancer screening at various ages. This may include screening for:  Colorectal cancer.  Prostate cancer.  Skin cancer.  Lung cancer. What should I know about heart disease, diabetes, and high blood pressure? Blood pressure and heart disease  High blood pressure causes heart disease and increases the risk of stroke. This is more likely to develop in people who have high blood pressure readings, are of African descent, or are overweight.  Talk with your health care provider about your target blood pressure readings.  Have your blood pressure checked: ? Every 3-5 years if you are 108-49 years of age. ? Every year if you are 41 years old or older.  If you are between the ages of 40 and 29 and are a current or former smoker, ask your health care provider if you should have a one-time screening for abdominal aortic aneurysm (AAA). Diabetes Have regular diabetes screenings. This checks your fasting blood sugar level. Have the screening done:  Once every three years after age 28 if you are at a normal weight and have a low risk for diabetes.  More often and at a younger age if you are overweight or have a high risk for diabetes. What should I know about preventing infection? Hepatitis B If you have a higher risk for hepatitis B, you should be screened for this virus. Talk with your health care provider to find out if you are at  risk for hepatitis B infection. Hepatitis C Blood testing is recommended for:  Everyone born from [birth year] through 1967.  Anyone with known risk factors for hepatitis C. Sexually transmitted infections (STIs)  You should be screened each year for STIs, including gonorrhea and chlamydia, if: ? You are sexually active and are younger than 60 years of age. ? You are older than 60 years of age and your health care provider tells you that you are at risk for this  type of infection. ? Your sexual activity has changed since you were last screened, and you are at increased risk for chlamydia or gonorrhea. Ask your health care provider if you are at risk.  Ask your health care provider about whether you are at high risk for HIV. Your health care provider may recommend a prescription medicine to help prevent HIV infection. If you choose to take medicine to prevent HIV, you should first get tested for HIV. You should then be tested every 3 months for as long as you are taking the medicine. Follow these instructions at home: Lifestyle  Do not use any products that contain nicotine or tobacco, such as cigarettes, e-cigarettes, and chewing tobacco. If you need help quitting, ask your health care provider.  Do not use street drugs.  Do not share needles.  Ask your health care provider for help if you need support or information about quitting drugs. Alcohol use  Do not drink alcohol if your health care provider tells you not to drink.  If you drink alcohol: ? Limit how much you have to 0-2 drinks a day. ? Be aware of how much alcohol is in your drink. In the [place] one drink equals one 12 oz bottle of beer (355 mL), one 5 oz glass of wine (148 mL), or one 1 oz glass of hard liquor (44 mL). General instructions  Schedule regular health, dental, and eye exams.  Stay current with your vaccines.  Tell your health care provider if: ? You often feel depressed. ? You have ever been abused or do not feel safe at home. Summary  Adopting a healthy lifestyle and getting preventive care are important in promoting health and wellness.  Follow your health care provider's instructions about healthy diet, exercising, and getting tested or screened for diseases.  Follow your health care provider's instructions on monitoring your cholesterol and blood pressure. This information is not intended to replace advice given to you by your health care provider. Make sure  you discuss any questions you have with your health care provider. Document Released: 10/27/2007 Document Revised: 04/23/2018 Document Reviewed: 04/23/2018 Elsevier Patient Education  2020 [employer].

## 2019-03-02 NOTE — Assessment & Plan Note (Signed)
Chronic, deterioration noted. Not on statin. Encouraged following low chol diet.  The 10-year ASCVD risk score Mikey Bussing DC Brooke Bonito., et al., 2013) is: 7%   Values used to calculate the score:     Age: 58 years     Sex: Male     Is Non-Hispanic African American: No     Diabetic: No     Tobacco smoker: No     Systolic Blood Pressure: 161 mmHg     Is BP treated: No     HDL Cholesterol: 78.3 mg/dL     Total Cholesterol: 249 mg/dL

## 2019-03-02 NOTE — Progress Notes (Signed)
This visit was conducted in person.  BP 122/80 (BP Location: Left Arm, Patient Position: Sitting, Cuff Size: Normal)   Pulse 69   Temp 97.8 F (36.6 C) (Temporal)   Ht 5\' 7"  (1.702 m)   Wt 153 lb 5 oz (69.5 kg)   SpO2 96%   BMI 24.01 kg/m     Hearing Screening   125Hz  250Hz  500Hz  1000Hz  2000Hz  3000Hz  4000Hz  6000Hz  8000Hz   Right ear:   20 40 20  0    Left ear:   0 0 0  0    Comments: Heard practice tone in left ear on 20 dbHL.   CC: CPE Subjective:    Patient ID: Samuel Chapman, male    DOB: Apr 11, 1961, 58 y.o.   MRN: [id number]  HPI: Samuel Chapman is a 58 y.o. male presenting on 03/02/2019 for Annual Exam   Zencker's diverticulum s/p diverticulectomy,[facility]at [facility]. PRN pepcid.occasional cough attributed to reflux.  Husband has noted some hearing loss. fmhx hearing loss.   Preventative: COLONOSCOPY Date: 2010 int hem, rpt 10 yrs Henrene Pastor).  Prostate cancer screening - discussed, requests screeningyearly. Nocturia x1. Will space out DRE, last done 2019.  Fluyearly  Tdap 2016 shingrix - discussed. Would like to wait for now.  Seat belt use discussed  Sunscreen use discussed, no changing moles on skin. Saw derm.  Nonsmoker Alcohol - 2-3 glasses of wine/day. Never >5 drinks.  Dentist regularly - had implant this year.  Eye exam - due   Lives with husband (Massimo Riner) Edu: university Occupation: works at [employer]: gym, works with trainer twice weekly, pilates, walks dog daily  Diet: good water, fruits/vegetables daily     Relevant past medical, surgical, family and social history reviewed and updated as indicated. Interim medical history since our last visit reviewed. Allergies and medications reviewed and updated. Outpatient Medications Prior to Visit  Medication Sig Dispense Refill  . famotidine (PEPCID) 20 MG tablet Take 20 mg by mouth 2 (two) times daily as needed for heartburn or indigestion.    Marland Kitchen loratadine  (CLARITIN) 10 MG tablet Take 10 mg by mouth daily as needed. As needed for seasonal allergies    . mometasone (NASONEX) 50 MCG/ACT nasal spray Place 2 sprays daily into the nose. (Patient taking differently: Place 2 sprays into the nose daily as needed. As needed for seasonal allergies) 17 g 3  . Multiple Vitamin (MULTIVITAMIN) tablet Take 1 tablet by mouth daily.    . Cholecalciferol (VITAMIN D3 PO) Take 1 capsule by mouth daily.    Marland Kitchen oseltamivir (TAMIFLU) 75 MG capsule Take 1 capsule (75 mg total) by mouth 2 (two) times daily. 10 capsule 0   No facility-administered medications prior to visit.      Per HPI unless specifically indicated in ROS section below Review of Systems  Constitutional: Negative for activity change, appetite change, chills, fatigue, fever and unexpected weight change.  HENT: Negative for hearing loss.   Eyes: Negative for visual disturbance.  Respiratory: Negative for cough, chest tightness, shortness of breath and wheezing.   Cardiovascular: Negative for chest pain, palpitations and leg swelling.  Gastrointestinal: Negative for abdominal distention, abdominal pain, blood in stool, constipation, diarrhea, nausea and vomiting.  Genitourinary: Negative for difficulty urinating and hematuria.  Musculoskeletal: Negative for arthralgias, myalgias and neck pain.  Skin: Negative for rash.  Neurological: Negative for dizziness, seizures, syncope and headaches.  Hematological: Negative for adenopathy. Does not bruise/bleed easily.  Psychiatric/Behavioral: Negative for dysphoric mood. The patient is  not nervous/anxious.    Objective:    BP 122/80 (BP Location: Left Arm, Patient Position: Sitting, Cuff Size: Normal)   Pulse 69   Temp 97.8 F (36.6 C) (Temporal)   Ht 5\' 7"  (1.702 m)   Wt 153 lb 5 oz (69.5 kg)   SpO2 96%   BMI 24.01 kg/m   Wt Readings from Last 3 Encounters:  03/02/19 153 lb 5 oz (69.5 kg)  05/22/18 154 lb 4 oz (70 kg)  01/03/18 154 lb (69.9 kg)     Physical Exam Vitals signs and nursing note reviewed.  Constitutional:      General: He is not in acute distress.    Appearance: Normal appearance. He is well-developed. He is not ill-appearing.  HENT:     Head: Normocephalic and atraumatic.     Right Ear: Hearing, ear canal and external ear normal. There is impacted cerumen.     Left Ear: Hearing, ear canal and external ear normal. There is impacted cerumen.     Ears:     Comments: Cerumen impaction covering both TMs s/p irrigation today    Nose: Nose normal.     Mouth/Throat:     Mouth: Mucous membranes are moist.     Pharynx: Oropharynx is clear. Uvula midline. No posterior oropharyngeal erythema.  Eyes:     General: No scleral icterus.    Extraocular Movements: Extraocular movements intact.     Conjunctiva/sclera: Conjunctivae normal.     Pupils: Pupils are equal, round, and reactive to light.  Neck:     Musculoskeletal: Normal range of motion and neck supple.  Cardiovascular:     Rate and Rhythm: Normal rate and regular rhythm.     Pulses: Normal pulses.          Radial pulses are 2+ on the right side and 2+ on the left side.     Heart sounds: Normal heart sounds. No murmur.  Pulmonary:     Effort: Pulmonary effort is normal. No respiratory distress.     Breath sounds: Normal breath sounds. No wheezing, rhonchi or rales.  Abdominal:     General: Abdomen is flat. Bowel sounds are normal. There is no distension.     Palpations: Abdomen is soft. There is no mass.     Tenderness: There is no abdominal tenderness. There is no guarding or rebound.     Hernia: No hernia is present.  Musculoskeletal: Normal range of motion.     Right lower leg: No edema.     Left lower leg: No edema.  Lymphadenopathy:     Cervical: No cervical adenopathy.  Skin:    General: Skin is warm and dry.     Findings: No rash.  Neurological:     General: No focal deficit present.     Mental Status: He is alert and oriented to person, place, and  time.     Comments: CN grossly intact, station and gait intact  Psychiatric:        Mood and Affect: Mood normal.        Behavior: Behavior normal.        Thought Content: Thought content normal.        Judgment: Judgment normal.       Results for orders placed or performed in visit on 02/23/19  PSA  Result Value Ref Range   PSA 0.95 0.10 - 4.00 ng/mL  Comprehensive metabolic panel  Result Value Ref Range   Sodium 141 135 - 145 mEq/L  Potassium 4.7 3.5 - 5.1 mEq/L   Chloride 103 96 - 112 mEq/L   CO2 31 19 - 32 mEq/L   Glucose, Bld 88 70 - 99 mg/dL   BUN 13 6 - 23 mg/dL   Creatinine, Ser 8.111.00 0.40 - 1.50 mg/dL   Total Bilirubin 0.4 0.2 - 1.2 mg/dL   Alkaline Phosphatase 64 39 - 117 U/L   AST 24 0 - 37 U/L   ALT 25 0 - 53 U/L   Total Protein 7.2 6.0 - 8.3 g/dL   Albumin 4.8 3.5 - 5.2 g/dL   Calcium 9.9 8.4 - 91.410.5 mg/dL   GFR 78.2976.18 >56.21>60.00 mL/min  Lipid panel  Result Value Ref Range   Cholesterol 249 (H) 0 - 200 mg/dL   Triglycerides 30.885.0 0.0 - 149.0 mg/dL   HDL 65.7878.30 >46.96>39.00 mg/dL   VLDL 29.517.0 0.0 - 28.440.0 mg/dL   LDL Cholesterol 132154 (H) 0 - 99 mg/dL   Total CHOL/HDL Ratio 3    NonHDL 171.18    Assessment & Plan:   Problem List Items Addressed This Visit    HLD (hyperlipidemia)    Chronic, deterioration noted. Not on statin. Encouraged following low chol diet.  The 10-year ASCVD risk score Denman George(Goff DC Montez HagemanJr., et al., 2013) is: 7%   Values used to calculate the score:     Age: [age] years     Sex: Male     Is Non-Hispanic African American: No     Diabetic: No     Tobacco smoker: No     Systolic Blood Pressure: 122 mmHg     Is BP treated: No     HDL Cholesterol: 78.3 mg/dL     Total Cholesterol: 249 mg/dL       History of excision of Zenker's diverticulum   Hearing loss, left    Pt denies significant trouble, husband remains concerned with hearing loss. Irrigation performed today successfully. Hearing test - significant L sided hearing loss. Will refer for formal audiology  evaluation      Relevant Orders   Ambulatory referral to Audiology   Health maintenance examination - Primary    Preventative protocols reviewed and updated unless pt declined. Discussed healthy diet and lifestyle.       GERD (gastroesophageal reflux disease)    Stable period on PRN pepcid. Continue.        Other Visit Diagnoses    Need for influenza vaccination       Relevant Orders   Flu Vaccine QUAD 36+ mos IM (Completed)       No orders of the defined types were placed in this encounter.  Orders Placed This Encounter  Procedures  . Flu Vaccine QUAD 36+ mos IM  . Ambulatory referral to Audiology    Referral Priority:   Routine    Referral Type:   Audiology Exam    Referral Reason:   Specialty Services Required    Number of Visits Requested:   1    Patient instructions: Flu shot today Shigrix shot at your convenience (2 shot shingles series).  Either get here (nurse visit) or at local pharmacy.  Ear irrigation performed today.  Return as needed or in 1 year for next physical.  Follow up plan: Return in about 1 year (around 03/01/2020) for annual exam, prior fasting for blood work.  Eustaquio BoydenJavier Brenn Deziel, MD

## 2019-03-02 NOTE — Assessment & Plan Note (Signed)
Preventative protocols reviewed and updated unless pt declined. Discussed healthy diet and lifestyle.  

## 2019-03-11 ENCOUNTER — Encounter: Payer: Self-pay | Admitting: Internal Medicine

## 2019-04-13 DIAGNOSIS — H903 Sensorineural hearing loss, bilateral: Secondary | ICD-10-CM | POA: Diagnosis not present

## 2019-04-13 DIAGNOSIS — Z7289 Other problems related to lifestyle: Secondary | ICD-10-CM | POA: Diagnosis not present

## 2019-04-14 HISTORY — PX: COLONOSCOPY: SHX174

## 2019-04-16 ENCOUNTER — Encounter: Payer: BC Managed Care – PPO | Admitting: Internal Medicine

## 2019-04-22 ENCOUNTER — Ambulatory Visit (AMBULATORY_SURGERY_CENTER): Payer: BC Managed Care – PPO | Admitting: *Deleted

## 2019-04-22 ENCOUNTER — Other Ambulatory Visit: Payer: Self-pay

## 2019-04-22 VITALS — Temp 96.8°F | Ht 67.0 in | Wt 153.2 lb

## 2019-04-22 DIAGNOSIS — Z1159 Encounter for screening for other viral diseases: Secondary | ICD-10-CM

## 2019-04-22 DIAGNOSIS — Z1211 Encounter for screening for malignant neoplasm of colon: Secondary | ICD-10-CM

## 2019-04-22 MED ORDER — NA SULFATE-K SULFATE-MG SULF 17.5-3.13-1.6 GM/177ML PO SOLN: ORAL | 0 refills | Status: DC

## 2019-04-22 NOTE — Progress Notes (Signed)
Patient is here in-person for PV. Patient denies any allergies to eggs or soy. Patient denies any problems with anesthesia/sedation. LOW BP after colonoscopy. Patient denies any oxygen use at home. Patient denies taking any diet/weight loss medications or blood thinners. Patient is not being treated for MRSA or C-diff. EMMI education assisgned to the patient for the procedure, this was explained and instructions given to patient. COVID-19 screening test is on 04/30/2019, the pt is aware. Pt is aware that care partner will wait in the car during procedure; if they feel like they will be too hot or cold to wait in the car; they may wait in the 4 th floor lobby. Patient is aware to bring only one care partner. We want them to wear a mask (we do not have any that we can provide them), practice social distancing, and we will check their temperatures when they get here.  I did remind the patient that their care partner needs to stay in the parking lot the entire time and have a cell phone available, we will call them when the pt is ready for discharge. Patient will wear mask into building.    Suprep $15 off coupon given to the patient.

## 2019-04-23 ENCOUNTER — Encounter: Payer: Self-pay | Admitting: Internal Medicine

## 2019-04-30 ENCOUNTER — Other Ambulatory Visit: Payer: Self-pay | Admitting: Internal Medicine

## 2019-04-30 ENCOUNTER — Ambulatory Visit (INDEPENDENT_AMBULATORY_CARE_PROVIDER_SITE_OTHER): Payer: BC Managed Care – PPO

## 2019-04-30 DIAGNOSIS — Z1159 Encounter for screening for other viral diseases: Secondary | ICD-10-CM

## 2019-05-01 LAB — SARS CORONAVIRUS 2 (TAT 6-24 HRS): SARS Coronavirus 2: NEGATIVE

## 2019-05-05 ENCOUNTER — Ambulatory Visit (AMBULATORY_SURGERY_CENTER): Payer: BC Managed Care – PPO | Admitting: Internal Medicine

## 2019-05-05 ENCOUNTER — Other Ambulatory Visit: Payer: Self-pay

## 2019-05-05 ENCOUNTER — Encounter: Payer: Self-pay | Admitting: Internal Medicine

## 2019-05-05 VITALS — BP 125/92 | HR 74 | Temp 97.9°F | Resp 15 | Ht 67.0 in | Wt 153.2 lb

## 2019-05-05 DIAGNOSIS — Z1211 Encounter for screening for malignant neoplasm of colon: Secondary | ICD-10-CM | POA: Diagnosis not present

## 2019-05-05 MED ORDER — SODIUM CHLORIDE 0.9 % IV SOLN: 500.0000 mL | Freq: Once | INTRAVENOUS | Status: DC

## 2019-05-05 NOTE — Progress Notes (Signed)
Report to PACU, RN, vss, BBS= Clear.  

## 2019-05-05 NOTE — Progress Notes (Signed)
Pt's states no medical or surgical changes since previsit or office visit.  Temp taken by JB VS taken by CW 

## 2019-05-05 NOTE — Patient Instructions (Signed)

## 2019-05-05 NOTE — Op Note (Signed)
[facility] Patient Name: Samuel Chapman [place] Procedure Date: 05/05/2019 7:57 AM MRN: [id number] Endoscopist: Wilhemina BonitoJohn N. Marina GoodellPerry , MD Age: [age] Referring MD:  Date of Birth: [birth year] Gender: Male Account #: [id number] Procedure:                Colonoscopy Indications:              Screening for colorectal malignant neoplasm.                            Negative index exam 2010 Medicines:                Monitored Anesthesia Care Procedure:                Pre-Anesthesia Assessment:                           - Prior to the procedure, a History and Physical                            was performed, and patient medications and                            allergies were reviewed. The patient's tolerance of                            previous anesthesia was also reviewed. The risks                            and benefits of the procedure and the sedation                            options and risks were discussed with the patient.                            All questions were answered, and informed consent                            was obtained. Prior Anticoagulants: The patient has                            taken no previous anticoagulant or antiplatelet                            agents. ASA Grade Assessment: I - A normal, healthy                            patient. After reviewing the risks and benefits,                            the patient was deemed in satisfactory condition to                            undergo the procedure.  After obtaining informed consent, the colonoscope                            was passed under direct vision. Throughout the                            procedure, the patient's blood pressure, pulse, and                            oxygen saturations were monitored continuously. The                            Colonoscope was introduced through the anus and                            advanced to the the cecum, identified by                             appendiceal orifice and ileocecal valve. The                            ileocecal valve, appendiceal orifice, and rectum                            were photographed. The quality of the bowel                            preparation was excellent. The colonoscopy was                            performed without difficulty. The patient tolerated                            the procedure well. The bowel preparation used was                            SUPREP via split dose instruction. Scope In: 8:15:17 AM Scope Out: 8:28:57 AM Scope Withdrawal Time: 0 hours 10 minutes 29 seconds  Total Procedure Duration: 0 hours 13 minutes 40 seconds  Findings:                 The entire examined colon appeared normal on direct                            and retroflexion views.                           Internal hemorrhoids were found during retroflexion. Complications:            No immediate complications. Estimated blood loss:                            None. Estimated Blood Loss:     Estimated blood loss: none. Impression:               - The entire examined  colon is normal on direct and                            retroflexion views.                           - No specimens collected. Recommendation:           - Repeat colonoscopy in 10 years for screening                            purposes.                           - Patient has a contact number available for                            emergencies. The signs and symptoms of potential                            delayed complications were discussed with the                            patient. Return to normal activities tomorrow.                            Written discharge instructions were provided to the                            patient.                           - Resume previous diet.                           - Continue present medications. Wilhemina Bonito. [name], MD 05/05/2019 8:42:07 AM This report has been signed electronically.

## 2019-05-07 ENCOUNTER — Telehealth: Payer: Self-pay | Admitting: *Deleted

## 2019-05-07 NOTE — Telephone Encounter (Signed)
Follow up call made, left message. 

## 2019-05-07 NOTE — Telephone Encounter (Signed)
Second follow up call made, left message. 

## 2019-07-13 ENCOUNTER — Encounter: Payer: Self-pay | Admitting: Family Medicine

## 2019-10-30 ENCOUNTER — Other Ambulatory Visit: Payer: Self-pay

## 2019-10-30 ENCOUNTER — Encounter: Payer: Self-pay | Admitting: Family Medicine

## 2019-10-30 ENCOUNTER — Telehealth (INDEPENDENT_AMBULATORY_CARE_PROVIDER_SITE_OTHER): Payer: BC Managed Care – PPO | Admitting: Family Medicine

## 2019-10-30 VITALS — Ht 68.0 in | Wt 148.0 lb

## 2019-10-30 DIAGNOSIS — J0100 Acute maxillary sinusitis, unspecified: Secondary | ICD-10-CM | POA: Diagnosis not present

## 2019-10-30 DIAGNOSIS — J3089 Other allergic rhinitis: Secondary | ICD-10-CM

## 2019-10-30 DIAGNOSIS — J019 Acute sinusitis, unspecified: Secondary | ICD-10-CM | POA: Insufficient documentation

## 2019-10-30 MED ORDER — MOMETASONE FUROATE 50 MCG/ACT NA SUSP: 2.0000 | Freq: Every day | NASAL | 3 refills | Status: DC

## 2019-10-30 NOTE — Progress Notes (Signed)
Virtual visit completed through MyChart, a video enabled telemedicine application. Due to national recommendations of social distancing due to COVID-19, a virtual visit is felt to be most appropriate for this patient at this time. Reviewed limitations, risks, security and privacy concerns of performing a virtual visit and the availability of in person appointments. I also reviewed that there may be a patient responsible charge related to this service. The patient agreed to proceed.   Patient location: home Provider location: [place] at [facility], office Persons participating in this virtual visit: patient, provider   If any vitals were documented, they were collected by patient at home unless specified below.    Ht 5\' 8"  (1.727 m)   Wt 148 lb (67.1 kg)   BMI 22.50 kg/m    CC: sinus congestion Subjective:    Patient ID: Samuel Chapman, male    DOB: 11-29-1960, 59 y.o.   MRN: [id number]  HPI: Samuel Chapman is a 59 y.o. male presenting on 10/30/2019 for Sinus Problem (Pt stated---sneezing, headache, clear drainage , fatigue---since last week. Denied fever)   3d h/o bilateral sinus pressure to nose and forehead R>L, sneezing, headache, clear drainage, fatigue. Mild cough.   No fevers/chills, ear or tooth pain, no dyspnea, loss of taste or smell, abd pain, nausea, diarrhea, body aches. No appetite changes.   Treating with asperina (italian remedy), claritin, pseudophed.  No sick contacts at home.  No smokers at home.  H/o allergic rhinitis and reactive airways. Not recently taking nasonex or claritin.   Recent flight to [place], returned Saturday.   COVID vaccine - completed Moderna 07/2019.        Relevant past medical, surgical, family and social history reviewed and updated as indicated. Interim medical history since our last visit reviewed. Allergies and medications reviewed and updated. Outpatient Medications Prior to Visit  Medication Sig Dispense Refill  . famotidine  (PEPCID) 20 MG tablet Take 20 mg by mouth 2 (two) times daily as needed for heartburn or indigestion.    . Multiple Vitamin (MULTIVITAMIN) tablet Take 1 tablet by mouth daily.    08/2019 loratadine (CLARITIN) 10 MG tablet Take 10 mg by mouth daily as needed. As needed for seasonal allergies (Patient not taking: Reported on 10/30/2019)    . mometasone (NASONEX) 50 MCG/ACT nasal spray Place 2 sprays daily into the nose. (Patient not taking: Reported on 04/22/2019) 17 g 3   No facility-administered medications prior to visit.     Per HPI unless specifically indicated in ROS section below Review of Systems Objective:  Ht 5\' 8"  (1.727 m)   Wt 148 lb (67.1 kg)   BMI 22.50 kg/m   Wt Readings from Last 3 Encounters:  10/30/19 148 lb (67.1 kg)  05/05/19 153 lb 3.2 oz (69.5 kg)  04/22/19 153 lb 3.2 oz (69.5 kg)       Physical exam: Gen: alert, NAD, not ill appearing Pulm: speaks in complete sentences without increased work of breathing, occasional interspersed cough Psych: normal mood, normal thought content       Assessment & Plan:   Problem List Items Addressed This Visit    Acute sinusitis    Anticipate viral given short duration Supportive care reviewed - fluids, rest, mucinex, restart claritin and nasonex. Symptoms to update 05/07/19 to consider abx reviewed - ie fever >101, worsening productive cough or dyspnea, unilateral facial pain, or ongoing past symptoms 7-10 days.       Relevant Medications   mometasone (NASONEX) 50 MCG/ACT nasal  spray    Other Visit Diagnoses    Non-seasonal allergic rhinitis, unspecified trigger    -  Primary   Relevant Medications   mometasone (NASONEX) 50 MCG/ACT nasal spray       Meds ordered this encounter  Medications  . mometasone (NASONEX) 50 MCG/ACT nasal spray    Sig: Place 2 sprays into the nose daily.    Dispense:  17 g    Refill:  3   No orders of the defined types were placed in this encounter.   I discussed the assessment and treatment  plan with the patient. The patient was provided an opportunity to ask questions and all were answered. The patient agreed with the plan and demonstrated an understanding of the instructions. The patient was advised to call back or seek an in-person evaluation if the symptoms worsen or if the condition fails to improve as anticipated.  Follow up plan: Return if symptoms worsen or fail to improve.  Ria Bush, MD

## 2019-10-30 NOTE — Assessment & Plan Note (Addendum)
Anticipate viral given short duration Supportive care reviewed - fluids, rest, mucinex, restart claritin and nasonex. Symptoms to update [place] to consider abx reviewed - ie fever >101, worsening productive cough or dyspnea, unilateral facial pain, or ongoing past symptoms 7-10 days.

## 2019-11-11 ENCOUNTER — Encounter: Payer: Self-pay | Admitting: Family Medicine

## 2020-02-22 ENCOUNTER — Other Ambulatory Visit: Payer: Self-pay | Admitting: Family Medicine

## 2020-02-22 DIAGNOSIS — Z8719 Personal history of other diseases of the digestive system: Secondary | ICD-10-CM

## 2020-02-22 DIAGNOSIS — E785 Hyperlipidemia, unspecified: Secondary | ICD-10-CM

## 2020-02-22 DIAGNOSIS — Z125 Encounter for screening for malignant neoplasm of prostate: Secondary | ICD-10-CM

## 2020-02-22 DIAGNOSIS — Z9889 Other specified postprocedural states: Secondary | ICD-10-CM

## 2020-02-25 ENCOUNTER — Other Ambulatory Visit: Payer: BC Managed Care – PPO

## 2020-03-03 ENCOUNTER — Encounter: Payer: BC Managed Care – PPO | Admitting: Family Medicine

## 2020-03-31 ENCOUNTER — Other Ambulatory Visit (INDEPENDENT_AMBULATORY_CARE_PROVIDER_SITE_OTHER): Payer: BC Managed Care – PPO

## 2020-03-31 ENCOUNTER — Other Ambulatory Visit: Payer: Self-pay

## 2020-03-31 DIAGNOSIS — E785 Hyperlipidemia, unspecified: Secondary | ICD-10-CM | POA: Diagnosis not present

## 2020-03-31 DIAGNOSIS — Z125 Encounter for screening for malignant neoplasm of prostate: Secondary | ICD-10-CM

## 2020-03-31 LAB — LIPID PANEL
Cholesterol: 197 mg/dL (ref 0–200)
HDL: 69.8 mg/dL (ref >39.00)
LDL Cholesterol: 112 mg/dL — ABNORMAL HIGH (ref 0–99)
NonHDL: 127.25
Total CHOL/HDL Ratio: 3
Triglycerides: 75 mg/dL (ref 0.0–149.0)
VLDL: 15 mg/dL (ref 0.0–40.0)

## 2020-03-31 LAB — COMPREHENSIVE METABOLIC PANEL
ALT: 17 U/L (ref 0–53)
AST: 20 U/L (ref 0–37)
Albumin: 4.3 g/dL (ref 3.5–5.2)
Alkaline Phosphatase: 58 U/L (ref 39–117)
BUN: 12 mg/dL (ref 6–23)
CO2: 33 mEq/L — ABNORMAL HIGH (ref 19–32)
Calcium: 9.2 mg/dL (ref 8.4–10.5)
Chloride: 103 mEq/L (ref 96–112)
Creatinine, Ser: 0.96 mg/dL (ref 0.40–1.50)
GFR: 85.46 mL/min (ref >60.00)
Glucose, Bld: 93 mg/dL (ref 70–99)
Potassium: 4.6 mEq/L (ref 3.5–5.1)
Sodium: 141 mEq/L (ref 135–145)
Total Bilirubin: 0.4 mg/dL (ref 0.2–1.2)
Total Protein: 6.7 g/dL (ref 6.0–8.3)

## 2020-03-31 LAB — PSA: PSA: 0.84 ng/mL (ref 0.10–4.00)

## 2020-04-13 ENCOUNTER — Other Ambulatory Visit: Payer: Self-pay

## 2020-04-13 ENCOUNTER — Ambulatory Visit (INDEPENDENT_AMBULATORY_CARE_PROVIDER_SITE_OTHER): Payer: BC Managed Care – PPO | Admitting: Family Medicine

## 2020-04-13 ENCOUNTER — Encounter: Payer: Self-pay | Admitting: Family Medicine

## 2020-04-13 VITALS — BP 122/82 | HR 75 | Temp 97.6°F | Ht 67.5 in | Wt 149.3 lb

## 2020-04-13 DIAGNOSIS — K219 Gastro-esophageal reflux disease without esophagitis: Secondary | ICD-10-CM

## 2020-04-13 DIAGNOSIS — Z0000 Encounter for general adult medical examination without abnormal findings: Secondary | ICD-10-CM

## 2020-04-13 DIAGNOSIS — Z23 Encounter for immunization: Secondary | ICD-10-CM | POA: Diagnosis not present

## 2020-04-13 DIAGNOSIS — E785 Hyperlipidemia, unspecified: Secondary | ICD-10-CM

## 2020-04-13 DIAGNOSIS — H9192 Unspecified hearing loss, left ear: Secondary | ICD-10-CM

## 2020-04-13 DIAGNOSIS — J301 Allergic rhinitis due to pollen: Secondary | ICD-10-CM | POA: Diagnosis not present

## 2020-04-13 DIAGNOSIS — Z8719 Personal history of other diseases of the digestive system: Secondary | ICD-10-CM

## 2020-04-13 DIAGNOSIS — Z9889 Other specified postprocedural states: Secondary | ICD-10-CM

## 2020-04-13 NOTE — Progress Notes (Signed)
Patient ID: Samuel Chapman, male    DOB: 1960/12/01, 59 y.o.   MRN: [id number]  This visit was conducted in person.  BP 122/82 (BP Location: Right Arm, Patient Position: Sitting, Cuff Size: Normal)   Pulse 75   Temp 97.6 F (36.4 C) (Temporal)   Ht 5' 7.5" (1.715 m)   Wt 149 lb 5 oz (67.7 kg)   SpO2 98%   BMI 23.04 kg/m    CC: CPE Subjective:   HPI: Samuel Chapman is a 59 y.o. male presenting on 04/13/2020 for Annual Exam   Planning upcoming trip to [place].   Zencker's diverticulum s/p diverticulectomy,[facility]raluminal approachat [facility]. PRNpepcid extra strength, watches diet, limits late night eating. Occasional cough attributed to reflux.  Fmhx hearing loss - saw audiologist last year ([facility]) - hearing loss noted, he wants to await hearing aides.  Notes hemorrhoid trouble - managing with prep H.   Increased activity recently.   Preventative: COLONOSCOPY 04/2019 - WNL rpt 10 yrs Samuel Chapman)  Prostate cancer screening - discussed, requests screeningyearly. Nocturia x1. Space out DRE, last done 2021.  Fluyearly  Tdap 2016 COVID vaccines Moderna 07/2199 x2, 01/2020 shingrix - discussed. Will get this year.  Seat belt use discussed  Sunscreen use discussed, no changing moles on skin.Sawderm. Nonsmoker Alcohol - 2-3 glasses of wine/day. Never >5 drinks. 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  Eye exam - yearly watching early cataracts  Lives with husband Photographer) Edu: university Occupation: works at [employer] Activity: gym, works with trainer twice weekly, pilates, walks dog daily, tennis twice weekly Diet: good water, fruits/vegetables daily     Relevant past medical, surgical, family and social history reviewed and updated as indicated. Interim medical history since our last visit reviewed. Allergies and medications reviewed and updated. Outpatient Medications Prior to Visit  Medication Sig Dispense Refill  .  famotidine (PEPCID) 10 MG tablet Take 10 mg by mouth daily as needed for heartburn or indigestion. Alternates with Pepcid Maximum    . famotidine (PEPCID) 20 MG tablet Take 20 mg by mouth daily as needed for heartburn or indigestion. Alternates with Pepcid Original    . loratadine (CLARITIN) 10 MG tablet Take 10 mg by mouth daily as needed. As needed for seasonal allergies    . mometasone (NASONEX) 50 MCG/ACT nasal spray Place 2 sprays into the nose daily. 17 g 3  . Multiple Vitamin (MULTIVITAMIN) tablet Take 1 tablet by mouth daily.     No facility-administered medications prior to visit.     Per HPI unless specifically indicated in ROS section below Review of Systems  Constitutional: Negative for activity change, appetite change, chills, fatigue, fever and unexpected weight change.  HENT: Negative for hearing loss.   Eyes: Negative for visual disturbance.  Respiratory: Negative for cough, chest tightness, shortness of breath and wheezing.   Cardiovascular: Negative for chest pain, palpitations and leg swelling.  Gastrointestinal: Positive for blood in stool (hemorrhoid related). Negative for abdominal distention, abdominal pain, constipation, diarrhea, nausea and vomiting.  Genitourinary: Negative for difficulty urinating and hematuria.  Musculoskeletal: Positive for back pain (?R sciatica noted after 2 hours of driving). Negative for arthralgias, myalgias and neck pain.  Skin: Negative for rash.  Neurological: Negative for dizziness, seizures, syncope and headaches.  Hematological: Negative for adenopathy. Does not bruise/bleed easily.  Psychiatric/Behavioral: Negative for dysphoric mood. The patient is not nervous/anxious.    Objective:  BP 122/82 (BP Location: Right Arm, Patient Position: Sitting, Cuff Size:  Normal)   Pulse 75   Temp 97.6 F (36.4 C) (Temporal)   Ht 5' 7.5" (1.715 m)   Wt 149 lb 5 oz (67.7 kg)   SpO2 98%   BMI 23.04 kg/m   Wt Readings from Last 3 Encounters:    04/13/20 149 lb 5 oz (67.7 kg)  10/30/19 148 lb (67.1 kg)  05/05/19 153 lb 3.2 oz (69.5 kg)      Physical Exam Vitals and nursing note reviewed.  Constitutional:      General: He is not in acute distress.    Appearance: Normal appearance. He is well-developed. He is not ill-appearing.  HENT:     Head: Normocephalic and atraumatic.     Right Ear: Hearing, tympanic membrane, ear canal and external ear normal.     Left Ear: Hearing, tympanic membrane, ear canal and external ear normal.  Eyes:     General: No scleral icterus.    Extraocular Movements: Extraocular movements intact.     Conjunctiva/sclera: Conjunctivae normal.     Pupils: Pupils are equal, round, and reactive to light.  Neck:     Thyroid: No thyroid mass or thyromegaly.     Vascular: No carotid bruit.  Cardiovascular:     Rate and Rhythm: Normal rate and regular rhythm.     Pulses: Normal pulses.          Radial pulses are 2+ on the right side and 2+ on the left side.     Heart sounds: Normal heart sounds. No murmur heard.   Pulmonary:     Effort: Pulmonary effort is normal. No respiratory distress.     Breath sounds: Normal breath sounds. No wheezing, rhonchi or rales.  Abdominal:     General: Abdomen is flat. Bowel sounds are normal. There is no distension.     Palpations: Abdomen is soft. There is no mass.     Tenderness: There is no abdominal tenderness. There is no guarding or rebound.     Hernia: No hernia is present.  Genitourinary:    Prostate: Normal. Not enlarged (20gm), not tender and no nodules present.     Rectum: Normal. No mass, tenderness, anal fissure, external hemorrhoid or internal hemorrhoid. Normal anal tone.  Musculoskeletal:        General: Normal range of motion.     Cervical back: Normal range of motion and neck supple.     Right lower leg: No edema.     Left lower leg: No edema.  Lymphadenopathy:     Cervical: No cervical adenopathy.  Skin:    General: Skin is warm and dry.      Findings: No rash.  Neurological:     General: No focal deficit present.     Mental Status: He is alert and oriented to person, place, and time.     Comments: CN grossly intact, station and gait intact  Psychiatric:        Mood and Affect: Mood normal.        Behavior: Behavior normal.        Thought Content: Thought content normal.        Judgment: Judgment normal.       Results for orders placed or performed in visit on 03/31/20  PSA  Result Value Ref Range   PSA 0.84 0.10 - 4.00 ng/mL  Comprehensive metabolic panel  Result Value Ref Range   Sodium 141 135 - 145 mEq/L   Potassium 4.6 3.5 - 5.1 mEq/L   Chloride  103 96 - 112 mEq/L   CO2 33 (H) 19 - 32 mEq/L   Glucose, Bld 93 70 - 99 mg/dL   BUN 12 6 - 23 mg/dL   Creatinine, Ser 4.74 0.40 - 1.50 mg/dL   Total Bilirubin 0.4 0.2 - 1.2 mg/dL   Alkaline Phosphatase 58 39 - 117 U/L   AST 20 0 - 37 U/L   ALT 17 0 - 53 U/L   Total Protein 6.7 6.0 - 8.3 g/dL   Albumin 4.3 3.5 - 5.2 g/dL   GFR 25.95 >63.87 mL/min   Calcium 9.2 8.4 - 10.5 mg/dL  Lipid panel  Result Value Ref Range   Cholesterol 197 0 - 200 mg/dL   Triglycerides 56.4 0 - 149 mg/dL   HDL 33.29 >51.88 mg/dL   VLDL 41.6 0.0 - 60.6 mg/dL   LDL Cholesterol 301 (H) 0 - 99 mg/dL   Total CHOL/HDL Ratio 3    NonHDL 127.25    Assessment & Plan:  This visit occurred during the SARS-CoV-2 public health emergency.  Safety protocols were in place, including screening questions prior to the visit, additional usage of staff PPE, and extensive cleaning of exam room while observing appropriate contact time as indicated for disinfecting solutions.   Problem List Items Addressed This Visit    Seasonal allergic rhinitis    Continue INS and claritin       HLD (hyperlipidemia)    Chronic, marked improvement with healthier lifestyle and increased exercise.  The 10-year ASCVD risk score Denman George DC Montez Hageman., et al., 2013) is: 6.8%   Values used to calculate the score:     Age: 59 years      Sex: Male     Is Non-Hispanic African American: No     Diabetic: No     Tobacco smoker: No     Systolic Blood Pressure: 122 mmHg     Is BP treated: No     HDL Cholesterol: 69.8 mg/dL     Total Cholesterol: 197 mg/dL       History of excision of Zenker's diverticulum   Hearing loss, left    Saw ENT and audiology, rec hearing aides, desires to avoid at this time.       Health maintenance examination - Primary    Preventative protocols reviewed and updated unless pt declined. Discussed healthy diet and lifestyle.       GERD (gastroesophageal reflux disease)    Continue pepcid.       Relevant Medications   famotidine (PEPCID) 10 MG tablet    Other Visit Diagnoses    Need for shingles vaccine       Relevant Orders   Varicella-zoster vaccine IM (Completed)       No orders of the defined types were placed in this encounter.  Orders Placed This Encounter  Procedures  . Varicella-zoster vaccine IM    Patient instructions: First shingrix shot today  Check with pharmacy about shingrix series.  Continue exercise routine - cholesterol levels were much improved this year! You are doing well today Return as needed or in 1 year for next physical.   Follow up plan: Return in about 1 year (around 04/13/2021) for annual exam, prior fasting for blood work.  Eustaquio Boyden, MD

## 2020-04-13 NOTE — Assessment & Plan Note (Signed)
Saw ENT and audiology, rec hearing aides, desires to avoid at this time.

## 2020-04-13 NOTE — Assessment & Plan Note (Signed)
Preventative protocols reviewed and updated unless pt declined. Discussed healthy diet and lifestyle.  

## 2020-04-13 NOTE — Assessment & Plan Note (Signed)
Chronic, marked improvement with healthier lifestyle and increased exercise.  The 10-year ASCVD risk score Denman George DC Montez Hageman., et al., 2013) is: 6.8%   Values used to calculate the score:     Age: 59 years     Sex: Male     Is Non-Hispanic African American: No     Diabetic: No     Tobacco smoker: No     Systolic Blood Pressure: 122 mmHg     Is BP treated: No     HDL Cholesterol: 69.8 mg/dL     Total Cholesterol: 197 mg/dL

## 2020-04-13 NOTE — Patient Instructions (Addendum)
First shingrix shot today  Check with pharmacy about shingrix series.  Continue exercise routine - cholesterol levels were much improved this year! You are doing well today Return as needed or in 1 year for next physical.   Health Maintenance, Male Adopting a healthy lifestyle and getting preventive care are important in promoting health and wellness. Ask your health care provider about:  The right schedule for you to have regular tests and exams.  Things you can do on your own to prevent diseases and keep yourself healthy. What should I know about diet, weight, and exercise? Eat a healthy diet   Eat a diet that includes plenty of vegetables, fruits, low-fat dairy products, and lean protein.  Do not eat a lot of foods that are high in solid fats, added sugars, or sodium. Maintain a healthy weight Body mass index (BMI) is a measurement that can be used to identify possible weight problems. It estimates body fat based on height and weight. Your health care provider can help determine your BMI and help you achieve or maintain a healthy weight. Get regular exercise Get regular exercise. This is one of the most important things you can do for your health. Most adults should:  Exercise for at least 150 minutes each week. The exercise should increase your heart rate and make you sweat (moderate-intensity exercise).  Do strengthening exercises at least twice a week. This is in addition to the moderate-intensity exercise.  Spend less time sitting. Even light physical activity can be beneficial. Watch cholesterol and blood lipids Have your blood tested for lipids and cholesterol at 61 years of age, then have this test every 5 years. You may need to have your cholesterol levels checked more often if:  Your lipid or cholesterol levels are high.  You are older than 61 years of age.  You are at high risk for heart disease. What should I know about cancer screening? Many types of cancers can  be detected early and may often be prevented. Depending on your health history and family history, you may need to have cancer screening at various ages. This may include screening for:  Colorectal cancer.  Prostate cancer.  Skin cancer.  Lung cancer. What should I know about heart disease, diabetes, and high blood pressure? Blood pressure and heart disease  High blood pressure causes heart disease and increases the risk of stroke. This is more likely to develop in people who have high blood pressure readings, are of African descent, or are overweight.  Talk with your health care provider about your target blood pressure readings.  Have your blood pressure checked: ? Every 3-5 years if you are 69-21 years of age. ? Every year if you are 24 years old or older.  If you are between the ages of 41 and 67 and are a current or former smoker, ask your health care provider if you should have a one-time screening for abdominal aortic aneurysm (AAA). Diabetes Have regular diabetes screenings. This checks your fasting blood sugar level. Have the screening done:  Once every three years after age 55 if you are at a normal weight and have a low risk for diabetes.  More often and at a younger age if you are overweight or have a high risk for diabetes. What should I know about preventing infection? Hepatitis B If you have a higher risk for hepatitis B, you should be screened for this virus. Talk with your health care provider to find out if you are  at risk for hepatitis B infection. Hepatitis C Blood testing is recommended for:  Everyone born from [birth year] through 1967.  Anyone with known risk factors for hepatitis C. Sexually transmitted infections (STIs)  You should be screened each year for STIs, including gonorrhea and chlamydia, if: ? You are sexually active and are younger than 61 years of age. ? You are older than 61 years of age and your health care provider tells you that you are at risk  for this type of infection. ? Your sexual activity has changed since you were last screened, and you are at increased risk for chlamydia or gonorrhea. Ask your health care provider if you are at risk.  Ask your health care provider about whether you are at high risk for HIV. Your health care provider may recommend a prescription medicine to help prevent HIV infection. If you choose to take medicine to prevent HIV, you should first get tested for HIV. You should then be tested every 3 months for as long as you are taking the medicine. Follow these instructions at home: Lifestyle  Do not use any products that contain nicotine or tobacco, such as cigarettes, e-cigarettes, and chewing tobacco. If you need help quitting, ask your health care provider.  Do not use street drugs.  Do not share needles.  Ask your health care provider for help if you need support or information about quitting drugs. Alcohol use  Do not drink alcohol if your health care provider tells you not to drink.  If you drink alcohol: ? Limit how much you have to 0-2 drinks a day. ? Be aware of how much alcohol is in your drink. In the [place] one drink equals one 12 oz bottle of beer (355 mL), one 5 oz glass of wine (148 mL), or one 1 oz glass of hard liquor (44 mL). General instructions  Schedule regular health, dental, and eye exams.  Stay current with your vaccines.  Tell your health care provider if: ? You often feel depressed. ? You have ever been abused or do not feel safe at home. Summary  Adopting a healthy lifestyle and getting preventive care are important in promoting health and wellness.  Follow your health care provider's instructions about healthy diet, exercising, and getting tested or screened for diseases.  Follow your health care provider's instructions on monitoring your cholesterol and blood pressure. This information is not intended to replace advice given to you by your health care provider.  Make sure you discuss any questions you have with your health care provider. Document Revised: 04/23/2018 Document Reviewed: 04/23/2018 Elsevier Patient Education  2020 [employer].

## 2020-04-13 NOTE — Assessment & Plan Note (Signed)
Continue INS and claritin

## 2020-04-13 NOTE — Assessment & Plan Note (Addendum)
Continue pepcid

## 2020-04-23 ENCOUNTER — Emergency Department (INDEPENDENT_AMBULATORY_CARE_PROVIDER_SITE_OTHER): Payer: BC Managed Care – PPO

## 2020-04-23 ENCOUNTER — Other Ambulatory Visit: Payer: Self-pay

## 2020-04-23 ENCOUNTER — Emergency Department (INDEPENDENT_AMBULATORY_CARE_PROVIDER_SITE_OTHER)
Admission: EM | Admit: 2020-04-23 | Discharge: 2020-04-23 | Disposition: A | Discharge status: Home / Self Care | Payer: BC Managed Care – PPO | Source: Home / Self Care

## 2020-04-23 DIAGNOSIS — S52591A Other fractures of lower end of right radius, initial encounter for closed fracture: Secondary | ICD-10-CM

## 2020-04-23 DIAGNOSIS — S52509A Unspecified fracture of the lower end of unspecified radius, initial encounter for closed fracture: Secondary | ICD-10-CM | POA: Diagnosis not present

## 2020-04-23 DIAGNOSIS — S52571A Other intraarticular fracture of lower end of right radius, initial encounter for closed fracture: Secondary | ICD-10-CM | POA: Diagnosis not present

## 2020-04-23 DIAGNOSIS — M25531 Pain in right wrist: Secondary | ICD-10-CM | POA: Diagnosis not present

## 2020-04-23 NOTE — ED Triage Notes (Signed)
Pt c/o RT wrist pain since last night after falling playing tennis. Motrin and ice prn. Wrist brace applied.

## 2020-04-23 NOTE — Discharge Instructions (Signed)
  Call Monday at 8AM to schedule an appointment with Dr. Benjamin Stain, Sports Medicine. Let them know you were seen at this urgent care and that you will be out of the country [place] 3 [place].   If you have trouble contacting the office and getting seen before you leave, feel free to call our office for assistance scheduling an appointment.   Keep splint on at all times except for brief periods to wash your hand or apply a cool compress. You may take 500mg  acetaminophen every 4-6 hours or in combination with ibuprofen 400-600mg  every 6-8 hours as needed for pain and inflammation.

## 2020-04-23 NOTE — ED Provider Notes (Signed)
[facility]    CSN: [id number] Arrival date & time: 04/23/20  1220      History   Chief Complaint Chief Complaint  Patient presents with  . Wrist Pain    RT    HPI Samuel Chapman is a 59 y.o. male.   HPI Samuel Chapman is a 59 y.o. male presenting to UC with c/o sudden onset Right wrist pain and swelling after falling while playing tennis last night. Pain is aching and sore. He has tried motrin and ice with mild relief. Pain is 5/10, worse with movement.   Pt notes he leaves for [place] for 3 weeks on Wednesday.   Past Medical History:  Diagnosis Date  . GERD (gastroesophageal reflux disease)   . History of chicken pox   . Influenza A 05/22/2018  . Internal hemorrhoid   . Low BP    post op   . RAD (reactive airway disease)    ?of this vs mild asthma  . Seasonal allergic rhinitis    tree pollen  . Zenker's hypopharyngeal diverticulum 04/2015    Patient Active Problem List   Diagnosis Date Noted  . Hearing loss, left 01/03/2018  . History of excision of Zenker's diverticulum 12/29/2015  . HLD (hyperlipidemia) 12/25/2015  . Health maintenance examination 12/27/2014  . GERD (gastroesophageal reflux disease) 08/16/2014  . Internal hemorrhoid 08/16/2014  . Seasonal allergic rhinitis     Past Surgical History:  Procedure Laterality Date  . COLONOSCOPY  01/20/2009   int hem, rpt 10 yrs [name])  . COLONOSCOPY  04/2019   WNL rpt 10 yrs [name])  . ESOPHAGOGASTRODUODENOSCOPY  2016   mod zenker's diverticulum, referred to tertiary center [name])  . HERNIA REPAIR  1963, 1966  . ZENKER'S DIVERTICULECTOMY ENDOSCOPIC  08/2015   Dr Wyline Mood at [facility] Medications    Prior to Admission medications   Medication Sig Start Date End Date Taking? Authorizing Provider  famotidine (PEPCID) 10 MG tablet Take 10 mg by mouth daily as needed for heartburn or indigestion. Alternates with Pepcid Maximum    [provider]  famotidine (PEPCID) 20 MG tablet  Take 20 mg by mouth daily as needed for heartburn or indigestion. Alternates with Pepcid Original    [provider]  loratadine (CLARITIN) 10 MG tablet Take 10 mg by mouth daily as needed. As needed for seasonal allergies    [provider]  mometasone (NASONEX) 50 MCG/ACT nasal spray Place 2 sprays into the nose daily. 10/30/19   Eustaquio Boyden, MD  Multiple Vitamin (MULTIVITAMIN) tablet Take 1 tablet by mouth daily.    [provider]    Family History Family History  Problem Relation Age of Onset  . Lung cancer Mother 53       lung (smoker)  . Ulcers Father 23       s/p gastrectomy, stomach hemorrhage  . Stroke Father 55       mild   . Aneurysm Father   . CAD Maternal Grandfather   . Diabetes Brother        borderline  . Colon cancer Neg Hx   . Stomach cancer Neg Hx   . Esophageal cancer Neg Hx   . Rectal cancer Neg Hx   . Colon polyps Neg Hx     Social History Social History   Tobacco Use  . Smoking status: Never Smoker  . Smokeless tobacco: Never Used  Vaping Use  . Vaping Use: Never used  Substance Use Topics  . Alcohol use: Yes    Alcohol/week: 21.0 standard drinks    Types: 21 Standard drinks or equivalent per week    Comment: 3 glasses wine /day  . Drug use: No     Allergies   Bee venom   Review of Systems Review of Systems  Musculoskeletal: Positive for arthralgias and joint swelling.  Skin: Negative for color change and wound.     Physical Exam Triage Vital Signs ED Triage Vitals  Enc Vitals Group     BP --      Pulse Rate 04/23/20 1239 82     Resp 04/23/20 1239 17     Temp 04/23/20 1239 99 F (37.2 C)     Temp Source 04/23/20 1239 Oral     SpO2 04/23/20 1239 98 %     Weight --      Height --      Head Circumference --      Peak Flow --      Pain Score 04/23/20 1241 5     Pain Loc --      Pain Edu? --      Excl. in GC? --    No data found.  Updated Vital Signs Pulse 82   Temp 99 F (37.2 C) (Oral)    Resp 17   SpO2 98%   Visual Acuity Right Eye Distance:   Left Eye Distance:   Bilateral Distance:    Right Eye Near:   Left Eye Near:    Bilateral Near:     Physical Exam Vitals and nursing note reviewed.  Constitutional:      Appearance: Normal appearance. He is well-developed and well-nourished.  HENT:     Head: Normocephalic and atraumatic.  Eyes:     Extraocular Movements: EOM normal.  Cardiovascular:     Rate and Rhythm: Normal rate and regular rhythm.     Pulses:          Radial pulses are 2+ on the right side.  Pulmonary:     Effort: Pulmonary effort is normal.  Musculoskeletal:        General: Swelling and tenderness present.     Cervical back: Normal range of motion.  Skin:    General: Skin is warm and dry.     Capillary Refill: Capillary refill takes less than 2 seconds.  Neurological:     Mental Status: He is alert and oriented to person, place, and time.     Sensory: No sensory deficit.  Psychiatric:        Mood and Affect: Mood and affect normal.        Behavior: Behavior normal.      UC Treatments / Results  Labs (all labs ordered are listed, but only abnormal results are displayed) Labs Reviewed - No data to display  EKG   Radiology DG Wrist Complete Right  Result Date: 04/23/2020 CLINICAL DATA:  Larey Seat playing tennis yesterday. Persistent wrist pain. EXAM: RIGHT WRIST - COMPLETE 3+ VIEW COMPARISON:  None. FINDINGS: There is a mildly impacted intra-articular fracture of the distal radius mainly along the volar portion. No ulnar fracture is identified. The carpal bones are intact. The visualized metacarpal bones are intact. IMPRESSION: Mildly impacted intra-articular distal radius fracture. Electronically Signed   By: Rudie Meyer M.D.   On: 04/23/2020 13:12    Procedures Procedures (including critical care time)  Medications Ordered in UC Medications - No data to display  Initial Impression / Assessment and  Plan / UC Course  I have  reviewed the triage vital signs and the nursing notes.  Pertinent labs & imaging results that were available during my care of the patient were reviewed by me and considered in my medical decision making (see chart for details).     Discussed imaging with pt Pt plans to leave for [place] for 3 weeks on Wednesday, 04/27/20. Encouraged to call Dr. Benjamin Stain Monday to schedule f/u appointment before he leaves AVS given  Final Clinical Impressions(s) / UC Diagnoses   Final diagnoses:  Closed fracture of distal end of radius, initial encounter     Discharge Instructions      Call Monday at 8AM to schedule an appointment with Dr. Benjamin Stain, Sports Medicine. Let them know you were seen at this urgent care and that you will be out of the country [place].   If you have trouble contacting the office and getting seen before you leave, feel free to call our office for assistance scheduling an appointment.   Keep splint on at all times except for brief periods to wash your hand or apply a cool compress. You may take 500mg  acetaminophen every 4-6 hours or in combination with ibuprofen 400-600mg  every 6-8 hours as needed for pain and inflammation.     ED Prescriptions    None     PDMP not reviewed this encounter.   , Lurene Shadow 04/23/20 1528

## 2020-04-25 ENCOUNTER — Ambulatory Visit (INDEPENDENT_AMBULATORY_CARE_PROVIDER_SITE_OTHER): Payer: BC Managed Care – PPO

## 2020-04-25 ENCOUNTER — Ambulatory Visit (INDEPENDENT_AMBULATORY_CARE_PROVIDER_SITE_OTHER): Payer: BC Managed Care – PPO | Admitting: Sports Medicine

## 2020-04-25 ENCOUNTER — Telehealth: Payer: Self-pay

## 2020-04-25 ENCOUNTER — Encounter: Payer: BC Managed Care – PPO | Admitting: Sports Medicine

## 2020-04-25 ENCOUNTER — Other Ambulatory Visit: Payer: Self-pay

## 2020-04-25 DIAGNOSIS — S52571A Other intraarticular fracture of lower end of right radius, initial encounter for closed fracture: Secondary | ICD-10-CM | POA: Insufficient documentation

## 2020-04-25 DIAGNOSIS — S52591A Other fractures of lower end of right radius, initial encounter for closed fracture: Secondary | ICD-10-CM

## 2020-04-25 DIAGNOSIS — S52501A Unspecified fracture of the lower end of right radius, initial encounter for closed fracture: Secondary | ICD-10-CM | POA: Diagnosis not present

## 2020-04-25 HISTORY — DX: Other intraarticular fracture of lower end of right radius, initial encounter for closed fracture: S52.571A

## 2020-04-25 MED ORDER — CALCIUM CARBONATE-VITAMIN D 600-400 MG-UNIT PO TABS: 1.0000 | ORAL_TABLET | Freq: Two times a day (BID) | ORAL | 11 refills | Status: DC

## 2020-04-25 MED ORDER — MELOXICAM 15 MG PO TABS: ORAL_TABLET | ORAL | 3 refills | Status: DC

## 2020-04-25 NOTE — Telephone Encounter (Signed)
Notes reviewed. Appreciate SM care.

## 2020-04-25 NOTE — Assessment & Plan Note (Signed)
This is a pleasant 59 year old male, a few days ago he fell onto an outstretched hand, he was seen in urgent care where x-rays showed an intra-articular minimally displaced fracture of the distal radius. He was placed in a brace and referred to me. Overall he is doing well, minimal discomfort at the fracture site, range of motion is nearly symmetric with about 5 degrees of extension lag. Neurovascular intact distally. Because he only has about a millimeter of joint line incongruity I think we can get this to heal appropriately without ORIF or closed reduction. He does understand the risk of posttraumatic osteoarthritis.  Adding some meloxicam for pain, calcium and vitamin D supplementation, he is going to [place] for several weeks, I would like a set of x-rays today and he can return to see me when he gets back for repeat imaging and follow-up. He does understand this will take 6 to 8 weeks to heal and will likely need some physical therapy after fracture healing.

## 2020-04-25 NOTE — Telephone Encounter (Signed)
Per appt notes pt has appt with DR  Benjamin Stain at [facility] 04/25/20 at 9:30.

## 2020-04-25 NOTE — Telephone Encounter (Signed)
[facility] Night - Client TELEPHONE ADVICE RECORD [facility] Patient Name: Samuel Chapman Gender: Male DOB: February 24, 1961 Age: 59 Y 3 M 29 D Return Phone Number: [phone] (Primary), [phone] (Secondary) Address[place] [address] Client [facility] Night - Client Client Site [facility] [place] - Night Physician Eustaquio Boyden - MD Contact Type Call Who Is Calling Patient / Member / Family / Caregiver Call Type Triage / Clinical Relationship To Patient Self Return Phone Number [phone] (Primary) Chief Complaint Hand or Wrist Injury Reason for Call Request to Schedule Office Appointment Initial Comment Caller states he was playing tennis last night and injured his writs. Translation No Nurse Assessment Nurse: Thad Ranger, RN, Yehuda Mao Date/Time (Eastern Time): 04/23/2020 11:29:57 AM Confirm and document reason for call. If symptomatic, describe symptoms. ---Caller states he was playing tennis last night and injured his wrist. He fell on it, swollen and sore. Going out of the country on Wed. Little bruising. Does the patient have any new or worsening symptoms? ---Yes Will a triage be completed? ---Yes Related visit to physician within the last 2 weeks? ---No Does the PT have any chronic conditions? (i.e. diabetes, asthma, this includes High risk factors for pregnancy, etc.) ---No Is this a behavioral health or substance abuse call? ---No Guidelines Guideline Title Affirmed Question Affirmed Notes Nurse Date/Time (Eastern Time) Hand and Wrist Injury Large swelling or bruise (> 2 inches or 5 cm) Reynolds, RN, Yehuda Mao 04/23/2020 11:32:03 AM Disp. Time Lamount Cohen Time) Disposition Final User 04/23/2020 11:37:08 AM See HCP within 4 Hours (or PCP triage) Yes Thad Ranger, RN, Yehuda Mao Disposition Overriden: See PCP within 24 Hours Override Reason: Patient's symptoms need a higher level of care Caller  Disagree/Comply Comply Caller Understands Yes PreDisposition Did not know what to do PLEASE NOTE: All timestamps contained within this report are represented as [place] Standard Time. CONFIDENTIALTY NOTICE: This fax transmission is intended only for the addressee. It contains information that is legally privileged, confidential or otherwise protected from use or disclosure. If you are not the intended recipient, you are strictly prohibited from reviewing, disclosing, copying using or disseminating any of this information or taking any action in reliance on or regarding this information. If you have received this fax in error, please notify us immediately by telephone so that we can arrange for its return to [place]. Phone: [phone], Toll-Free: [phone], Fax: [phone] Page: 2 of 2 Call Id: [id number] Care Advice Given Per Guideline SEE HCP (OR PCP TRIAGE) WITHIN 4 HOURS: * UCC: Some UCCs can manage patients who are stable and have less serious symptoms (e.g., minor illnesses and injuries). The triager must know the [facility] capabilities before sending a patient there. If unsure, call ahead. LOCAL COLD: * Apply cold pack or an ice bag (wrapped in a moist towel). PAIN MEDICINES: * For pain relief, you can take either acetaminophen, ibuprofen, or naproxen. * They are over-the-counter (OTC) pain drugs. You can buy them at the drugstore. CALL BACK IF: * You become worse CARE ADVICE given per Hand and Wrist Injury (Adult) guideline. Comments User: Judeth Cornfield, RN Date/Time Lamount Cohen Time): 04/23/2020 11:37:07 AM Outcome upgraded due to PCP closed, pt may have a fx, would be best if he was seen at [facility] for x ray. Referrals [facility] - UC

## 2020-04-25 NOTE — Progress Notes (Signed)
    Procedures performed today:    None.  Independent interpretation of notes and tests performed by another provider:   X-rays personally reviewed and show a distal radius fracture, with a depressed articular surface of only about a millimeter of joint line incongruity.  Brief History, Exam, Impression, and Recommendations:    Closed die punch fracture distal radius, right, initial encounter This is a pleasant 59 year old male, a few days ago he fell onto an outstretched hand, he was seen in urgent care where x-rays showed an intra-articular minimally displaced fracture of the distal radius. He was placed in a brace and referred to me. Overall he is doing well, minimal discomfort at the fracture site, range of motion is nearly symmetric with about 5 degrees of extension lag. Neurovascular intact distally. Because he only has about a millimeter of joint line incongruity I think we can get this to heal appropriately without ORIF or closed reduction. He does understand the risk of posttraumatic osteoarthritis.  Adding some meloxicam for pain, calcium and vitamin D supplementation, he is going to [place] for several weeks, I would like a set of x-rays today and he can return to see me when he gets back for repeat imaging and follow-up. He does understand this will take 6 to 8 weeks to heal and will likely need some physical therapy after fracture healing.    ___________________________________________ Ihor Austin. Benjamin Stain, M.D., ABFM., CAQSM. Primary Care and Sports Medicine [facility]  Adjunct Instructor of Family Medicine  [employer]

## 2020-05-23 ENCOUNTER — Ambulatory Visit (INDEPENDENT_AMBULATORY_CARE_PROVIDER_SITE_OTHER): Payer: BC Managed Care – PPO

## 2020-05-23 ENCOUNTER — Other Ambulatory Visit: Payer: Self-pay

## 2020-05-23 ENCOUNTER — Ambulatory Visit (INDEPENDENT_AMBULATORY_CARE_PROVIDER_SITE_OTHER): Payer: BC Managed Care – PPO | Admitting: Sports Medicine

## 2020-05-23 DIAGNOSIS — S52591A Other fractures of lower end of right radius, initial encounter for closed fracture: Secondary | ICD-10-CM | POA: Diagnosis not present

## 2020-05-23 DIAGNOSIS — M7989 Other specified soft tissue disorders: Secondary | ICD-10-CM | POA: Diagnosis not present

## 2020-05-23 DIAGNOSIS — S52571A Other intraarticular fracture of lower end of right radius, initial encounter for closed fracture: Secondary | ICD-10-CM

## 2020-05-23 NOTE — Progress Notes (Signed)
    Procedures performed today:    None.  Independent interpretation of notes and tests performed by another provider:   None.  Brief History, Exam, Impression, and Recommendations:    Closed die punch fracture distal radius, right, initial encounter Samuel Chapman returns, he just came back from [place], I have not seen him in about 3 to 4 weeks, he is approximately 4 weeks post distal radius fracture with about a millimeter of joint line incongruity. He has done well in a Exos cast. Repeating x-rays today, symptoms have improved considerably, it is still swollen and he does lack motion as expected He can remove the cast in about 2 weeks but if he has pain over the fracture site he should continue it. Of note he did bring me some honey soap from [place]. Return to see me in a month.    ___________________________________________ Ihor Austin. Benjamin Stain, M.D., ABFM., CAQSM. Primary Care and Sports Medicine [facility]  Adjunct Instructor of Family Medicine  [employer]

## 2020-05-23 NOTE — Assessment & Plan Note (Addendum)
Samuel Chapman returns, he just came back from [place], I have not seen him in about 3 to 4 weeks, he is approximately 4 weeks post distal radius fracture with about a millimeter of joint line incongruity. He has done well in a Exos cast. Repeating x-rays today, symptoms have improved considerably, it is still swollen and he does lack motion as expected He can remove the cast in about 2 weeks but if he has pain over the fracture site he should continue it. Of note he did bring me some honey soap from [place]. Return to see me in a month.

## 2020-05-25 DIAGNOSIS — Z20822 Contact with and (suspected) exposure to covid-19: Secondary | ICD-10-CM | POA: Diagnosis not present

## 2020-06-14 ENCOUNTER — Other Ambulatory Visit: Payer: Self-pay

## 2020-06-14 ENCOUNTER — Telehealth: Payer: Self-pay

## 2020-06-14 ENCOUNTER — Ambulatory Visit: Payer: BC Managed Care – PPO

## 2020-06-14 NOTE — Telephone Encounter (Signed)
Patient was scheduled to receive his second dose of Shingrix today. Unfortunately his first dose was given on 04/13/2020. In order for insurance to cover, this would have to be administered on or after 06/15/2020. Called and re-scheduled appointment to Thursday, 06/16/2020. Patient verbalized understanding.

## 2020-06-16 ENCOUNTER — Ambulatory Visit (INDEPENDENT_AMBULATORY_CARE_PROVIDER_SITE_OTHER): Payer: BC Managed Care – PPO

## 2020-06-16 ENCOUNTER — Other Ambulatory Visit: Payer: Self-pay

## 2020-06-16 DIAGNOSIS — Z23 Encounter for immunization: Secondary | ICD-10-CM

## 2020-06-20 ENCOUNTER — Ambulatory Visit: Payer: BC Managed Care – PPO | Admitting: Sports Medicine

## 2020-06-24 ENCOUNTER — Ambulatory Visit (INDEPENDENT_AMBULATORY_CARE_PROVIDER_SITE_OTHER): Payer: BC Managed Care – PPO | Admitting: Sports Medicine

## 2020-06-24 ENCOUNTER — Other Ambulatory Visit: Payer: Self-pay

## 2020-06-24 ENCOUNTER — Ambulatory Visit (INDEPENDENT_AMBULATORY_CARE_PROVIDER_SITE_OTHER): Payer: BC Managed Care – PPO

## 2020-06-24 DIAGNOSIS — S52591D Other fractures of lower end of right radius, subsequent encounter for closed fracture with routine healing: Secondary | ICD-10-CM

## 2020-06-24 DIAGNOSIS — S52571A Other intraarticular fracture of lower end of right radius, initial encounter for closed fracture: Secondary | ICD-10-CM

## 2020-06-24 DIAGNOSIS — S52501A Unspecified fracture of the lower end of right radius, initial encounter for closed fracture: Secondary | ICD-10-CM | POA: Diagnosis not present

## 2020-06-24 NOTE — Assessment & Plan Note (Signed)
This is a very pleasant 60 year old male, he is about 8 weeks post intra-articular fracture of his distal radius, doing well. At this point discontinue Exos cast, getting an updated x-ray. He will start using the wrist and do range of motion exercises. If not fully better in a month we will do an intra-articular injection, I think he is cleared to start working out as well with 5 to 10 pounds max and swinging a tennis racquet.

## 2020-06-24 NOTE — Progress Notes (Addendum)
    Procedures performed today:    None.  Independent interpretation of notes and tests performed by another provider:   Distal radius intra-articular fracture does appear stable, fracture line is still visible.   Brief History, Exam, Impression, and Recommendations:    Closed die punch fracture distal radius, right, initial encounter This is a very pleasant 60 year old male, he is about 8 weeks post intra-articular fracture of his distal radius, doing well. At this point discontinue Exos cast, getting an updated x-ray. He will start using the wrist and do range of motion exercises. If not fully better in a month we will do an intra-articular injection, I think he is cleared to start working out as well with 5 to 10 pounds max and swinging a tennis racquet.    ___________________________________________ Ihor Austin. Benjamin Stain, M.D., ABFM., CAQSM. Primary Care and Sports Medicine [facility]  Adjunct Instructor of Family Medicine  [employer]

## 2020-07-22 ENCOUNTER — Ambulatory Visit: Payer: BC Managed Care – PPO | Admitting: Sports Medicine

## 2020-10-08 DIAGNOSIS — Z20822 Contact with and (suspected) exposure to covid-19: Secondary | ICD-10-CM | POA: Diagnosis not present

## 2020-10-18 ENCOUNTER — Telehealth (INDEPENDENT_AMBULATORY_CARE_PROVIDER_SITE_OTHER): Payer: BC Managed Care – PPO | Admitting: Family Medicine

## 2020-10-18 ENCOUNTER — Encounter: Payer: Self-pay | Admitting: Family Medicine

## 2020-10-18 ENCOUNTER — Other Ambulatory Visit: Payer: Self-pay

## 2020-10-18 VITALS — BP 118/74 | Temp 96.9°F | Ht 67.5 in

## 2020-10-18 DIAGNOSIS — R059 Cough, unspecified: Secondary | ICD-10-CM

## 2020-10-18 MED ORDER — BENZONATATE 200 MG PO CAPS: 200.0000 mg | ORAL_CAPSULE | Freq: Two times a day (BID) | ORAL | 0 refills | Status: DC | PRN

## 2020-10-18 MED ORDER — AZITHROMYCIN 250 MG PO TABS: ORAL_TABLET | ORAL | 0 refills | Status: DC

## 2020-10-18 NOTE — Progress Notes (Signed)
VIRTUAL VISIT Due to national recommendations of social distancing due to COVID 19, a virtual visit is felt to be most appropriate for this patient at this time.   I connected with the patient on 10/18/20 at 12:00 PM EDT by virtual telehealth platform and verified that I am speaking with the correct person using two identifiers.   I discussed the limitations, risks, security and privacy concerns of performing an evaluation and management service by  virtual telehealth platform and the availability of in person appointments. I also discussed with the patient that there may be a patient responsible charge related to this service. The patient expressed understanding and agreed to proceed.  Patient location: Home Provider Location: [place] Participants: Kerby Nora and Nicola Police   Chief Complaint  Patient presents with   Cough   Fatigue    History of Present Illness:   60 year old male patient of Dr Sharen Hones  presents with new onset cough and fatigue.  date of onset : 10/07/2020  Started with congestion and sore throat.  No runny nose. No face pain, no ear pain.   Has progressed to move into chest .. cough up phlegm.. clear to yellow.  Cough not keeping him up at night.  No SOB, no wheezing.   Nonsmoker   No fever.  He has been using delsym, tylenol. Claritin.  COVID 19 screen COVID testing: PCR 10/08/2020 negative COVID vaccine: 08/11/2019 , 07/15/2019   Moderna Covid-19 Booster Vaccine 02/11/2020, 09/17/2020  COVID exposure: No recent travel or known exposure to COVID19.. partner with viral URI as well.  The importance of social distancing was discussed today.    Review of Systems  All other systems reviewed and are negative.    Past Medical History:  Diagnosis Date   GERD (gastroesophageal reflux disease)    History of chicken pox    Influenza A 05/22/2018   Internal hemorrhoid    Low BP    post op    RAD (reactive airway disease)    ?of this vs mild  asthma   Seasonal allergic rhinitis    tree pollen   Zenker's hypopharyngeal diverticulum 04/2015    reports that he has never smoked. He has never used smokeless tobacco. He reports current alcohol use of about 21.0 standard drinks of alcohol per week. He reports that he does not use drugs.   Current Outpatient Medications:    famotidine (PEPCID) 10 MG tablet, Take 10 mg by mouth daily as needed for heartburn or indigestion. Alternates with Pepcid Maximum, Disp: , Rfl:    famotidine (PEPCID) 20 MG tablet, Take 20 mg by mouth daily as needed for heartburn or indigestion. Alternates with Pepcid Original, Disp: , Rfl:    loratadine (CLARITIN) 10 MG tablet, Take 10 mg by mouth daily as needed. As needed for seasonal allergies, Disp: , Rfl:    Multiple Vitamin (MULTIVITAMIN) tablet, Take 1 tablet by mouth daily., Disp: , Rfl:    Observations/Objective: Blood pressure 118/74, temperature (!) 96.9 F (36.1 C), temperature source Oral, height 5' 7.5" (1.715 m).  Physical Exam  Physical Exam Constitutional:      General: The patient is not in acute distress. Pulmonary:     Effort: Pulmonary effort is normal. No respiratory distress.  Neurological:     Mental Status: The patient is alert and oriented to person, place, and time.  Psychiatric:        Mood and Affect: Mood normal.  Behavior: Behavior normal.   Assessment and Plan    I discussed the assessment and treatment plan with the patient. The patient was provided an opportunity to ask questions and all were answered. The patient agreed with the plan and demonstrated an understanding of the instructions.   The patient was advised to call back or seek an in-person evaluation if the symptoms worsen or if the condition fails to improve as anticipated.    Problem List Items Addressed This Visit     Cough - Primary    Negative COVID testing, quadruple vaccinated, no exposure.  Most likely viral URI, but given lasting > 7-10 days..  cover for superimpose bacterial infection.  Meds ordered this encounter  Medications   benzonatate (TESSALON) 200 MG capsule    Sig: Take 1 capsule (200 mg total) by mouth 2 (two) times daily as needed for cough.    Dispense:  20 capsule    Refill:  0   azithromycin (ZITHROMAX) 250 MG tablet    Sig: 2 tab po x 1 day then 1 tab po daily    Dispense:  6 tablet    Refill:  0              Kerby Nora, MD

## 2020-12-28 DIAGNOSIS — R059 Cough, unspecified: Secondary | ICD-10-CM | POA: Insufficient documentation

## 2020-12-28 NOTE — Assessment & Plan Note (Signed)
Negative COVID testing, quadruple vaccinated, no exposure.  Most likely viral URI, but given lasting > 7-10 days.. cover for superimpose bacterial infection.  Meds ordered this encounter  Medications  . benzonatate (TESSALON) 200 MG capsule    Sig: Take 1 capsule (200 mg total) by mouth 2 (two) times daily as needed for cough.    Dispense:  20 capsule    Refill:  0  . azithromycin (ZITHROMAX) 250 MG tablet    Sig: 2 tab po x 1 day then 1 tab po daily    Dispense:  6 tablet    Refill:  0

## 2021-03-31 ENCOUNTER — Ambulatory Visit: Payer: BC Managed Care – PPO

## 2021-04-14 ENCOUNTER — Ambulatory Visit (INDEPENDENT_AMBULATORY_CARE_PROVIDER_SITE_OTHER): Payer: BC Managed Care – PPO | Admitting: Family Medicine

## 2021-04-14 ENCOUNTER — Other Ambulatory Visit: Payer: Self-pay

## 2021-04-14 ENCOUNTER — Encounter: Payer: Self-pay | Admitting: Family Medicine

## 2021-04-14 VITALS — BP 120/80 | HR 77 | Temp 97.7°F | Ht 67.0 in | Wt 149.4 lb

## 2021-04-14 DIAGNOSIS — Z0000 Encounter for general adult medical examination without abnormal findings: Secondary | ICD-10-CM

## 2021-04-14 DIAGNOSIS — K219 Gastro-esophageal reflux disease without esophagitis: Secondary | ICD-10-CM | POA: Diagnosis not present

## 2021-04-14 DIAGNOSIS — E785 Hyperlipidemia, unspecified: Secondary | ICD-10-CM | POA: Diagnosis not present

## 2021-04-14 DIAGNOSIS — Z125 Encounter for screening for malignant neoplasm of prostate: Secondary | ICD-10-CM | POA: Diagnosis not present

## 2021-04-14 NOTE — Patient Instructions (Addendum)
Labs today  °You are doing well today  °Return as needed or in 1 year for next physical.  ° °Health Maintenance, Male °Adopting a healthy lifestyle and getting preventive care are important in promoting health and wellness. Ask your health care provider about: °The right schedule for you to have regular tests and exams. °Things you can do on your own to prevent diseases and keep yourself healthy. °What should I know about diet, weight, and exercise? °Eat a healthy diet ° °Eat a diet that includes plenty of vegetables, fruits, low-fat dairy products, and lean protein. °Do not eat a lot of foods that are high in solid fats, added sugars, or sodium. °Maintain a healthy weight °Body mass index (BMI) is a measurement that can be used to identify possible weight problems. It estimates body fat based on height and weight. Your health care provider can help determine your BMI and help you achieve or maintain a healthy weight. °Get regular exercise °Get regular exercise. This is one of the most important things you can do for your health. Most adults should: °Exercise for at least 150 minutes each week. The exercise should increase your heart rate and make you sweat (moderate-intensity exercise). °Do strengthening exercises at least twice a week. This is in addition to the moderate-intensity exercise. °Spend less time sitting. Even light physical activity can be beneficial. °Watch cholesterol and blood lipids °Have your blood tested for lipids and cholesterol at 62 years of age, then have this test every 5 years. °You may need to have your cholesterol levels checked more often if: °Your lipid or cholesterol levels are high. °You are older than 62 years of age. °You are at high risk for heart disease. °What should I know about cancer screening? °Many types of cancers can be detected early and may often be prevented. Depending on your health history and family history, you may need to have cancer screening at various ages.  This may include screening for: °Colorectal cancer. °Prostate cancer. °Skin cancer. °Lung cancer. °What should I know about heart disease, diabetes, and high blood pressure? °Blood pressure and heart disease °High blood pressure causes heart disease and increases the risk of stroke. This is more likely to develop in people who have high blood pressure readings or are overweight. °Talk with your health care provider about your target blood pressure readings. °Have your blood pressure checked: °Every 3-5 years if you are 18-39 years of age. °Every year if you are 40 years old or older. °If you are between the ages of 65 and 75 and are a current or former smoker, ask your health care provider if you should have a one-time screening for abdominal aortic aneurysm (AAA). °Diabetes °Have regular diabetes screenings. This checks your fasting blood sugar level. Have the screening done: °Once every three years after age 45 if you are at a normal weight and have a low risk for diabetes. °More often and at a younger age if you are overweight or have a high risk for diabetes. °What should I know about preventing infection? °Hepatitis B °If you have a higher risk for hepatitis B, you should be screened for this virus. Talk with your health care provider to find out if you are at risk for hepatitis B infection. °Hepatitis C °Blood testing is recommended for: °Everyone born from 1945 through 1965. °Anyone with known risk factors for hepatitis C. °Sexually transmitted infections (STIs) °You should be screened each year for STIs, including gonorrhea and chlamydia, if: °You   are sexually active and are younger than 62 years of age. °You are older than 62 years of age and your health care provider tells you that you are at risk for this type of infection. °Your sexual activity has changed since you were last screened, and you are at increased risk for chlamydia or gonorrhea. Ask your health care provider if you are at risk. °Ask your  health care provider about whether you are at high risk for HIV. Your health care provider may recommend a prescription medicine to help prevent HIV infection. If you choose to take medicine to prevent HIV, you should first get tested for HIV. You should then be tested every 3 months for as long as you are taking the medicine. °Follow these instructions at home: °Alcohol use °Do not drink alcohol if your health care provider tells you not to drink. °If you drink alcohol: °Limit how much you have to 0-2 drinks a day. °Know how much alcohol is in your drink. In the [place] one drink equals one 12 oz bottle of beer (355 mL), one 5 oz glass of wine (148 mL), or one 1½ oz glass of hard liquor (44 mL). °Lifestyle °Do not use any products that contain nicotine or tobacco. These products include cigarettes, chewing tobacco, and vaping devices, such as e-cigarettes. If you need help quitting, ask your health care provider. °Do not use street drugs. °Do not share needles. °Ask your health care provider for help if you need support or information about quitting drugs. °General instructions °Schedule regular health, dental, and eye exams. °Stay current with your vaccines. °Tell your health care provider if: °You often feel depressed. °You have ever been abused or do not feel safe at home. °Summary °Adopting a healthy lifestyle and getting preventive care are important in promoting health and wellness. °Follow your health care provider's instructions about healthy diet, exercising, and getting tested or screened for diseases. °Follow your health care provider's instructions on monitoring your cholesterol and blood pressure. °This information is not intended to replace advice given to you by your health care provider. Make sure you discuss any questions you have with your health care provider. °Document Revised: 09/19/2020 Document Reviewed: 09/19/2020 °Elsevier Patient Education © 2022 [employer]. ° °

## 2021-04-14 NOTE — Assessment & Plan Note (Addendum)
Continues pepcid

## 2021-04-14 NOTE — Progress Notes (Signed)
Patient ID: Samuel Chapman, male    DOB: 10-10-1960, 60 y.o.   MRN: [id number]  This visit was conducted in person.  BP 120/80   Pulse 77   Temp 97.7 F (36.5 C) (Temporal)   Ht 5\' 7"  (1.702 m)   Wt 149 lb 6 oz (67.8 kg)   SpO2 97%   BMI 23.40 kg/m    CC: CPE Subjective:   HPI: Samuel Chapman is a 60 y.o. male presenting on 04/14/2021 for Annual Exam   Leaving on trip to [place]2020 later this month.   COVID 10/2020 - symptoms fully resolved.   Suffered R distal radius intraarticular fracture last year after fell while playing tennis - treated with Exos cast (Thekkekandam).    Zencker's diverticulum s/p diverticulectomy, intraluminal approach at [facility] 08/2015. PRN pepcid extra strength, watches diet, limits late night eating. Occasional cough attributed to reflux.    Chronic R sciatica symptoms worse with prolonged sitting (ie >2hr car ride)  Preventative: COLONOSCOPY 04/2019 - WNL rpt 10 yrs 05/2019)  Prostate cancer screening - nocturia x1. Yearly screen.  Lung cancer screening - not eligible  Flu yearly  COVID vaccines Moderna 07/2199 x2, booster 01/2020, bivalent booster 02/2021 Tdap 2016 Shingrix - 04/2020, 06/2020 Seat belt use discussed  Sunscreen use discussed, no changing moles on skin.  Sleep - averaging 8 hours/night Non-smoker Alcohol - 2-3 glasses of wine/day.  Dentist regularly - had implant this year.  Eye exam - yearly watching early cataracts  Lives with husband 07/2020) Edu: university Occupation: works at [employer] Activity: gym, works with trainer twice weekly, pilates, walks dog daily, tennis twice weekly Diet: good water, fruits/vegetables daily     Relevant past medical, surgical, family and social history reviewed and updated as indicated. Interim medical history since our last visit reviewed. Allergies and medications reviewed and updated. Outpatient Medications Prior to Visit  Medication Sig Dispense Refill   Ascorbic Acid (VITAMIN C PO)  Take by mouth daily.     famotidine (PEPCID) 10 MG tablet Take 10 mg by mouth daily as needed for heartburn or indigestion. Alternates with Pepcid Maximum     famotidine (PEPCID) 20 MG tablet Take 20 mg by mouth daily as needed for heartburn or indigestion. Alternates with Pepcid Original     loratadine (CLARITIN) 10 MG tablet Take 10 mg by mouth daily as needed. As needed for seasonal allergies     Multiple Vitamin (MULTIVITAMIN) tablet Take 1 tablet by mouth daily.     azithromycin (ZITHROMAX) 250 MG tablet 2 tab po x 1 day then 1 tab po daily 6 tablet 0   benzonatate (TESSALON) 200 MG capsule Take 1 capsule (200 mg total) by mouth 2 (two) times daily as needed for cough. 20 capsule 0   No facility-administered medications prior to visit.     Per HPI unless specifically indicated in ROS section below Review of Systems  Constitutional:  Negative for activity change, appetite change, chills, fatigue, fever and unexpected weight change.  HENT:  Negative for hearing loss.   Eyes:  Negative for visual disturbance.  Respiratory:  Negative for cough, chest tightness, shortness of breath and wheezing.   Cardiovascular:  Negative for chest pain, palpitations and leg swelling.  Gastrointestinal:  Negative for abdominal distention, abdominal pain, blood in stool, constipation, diarrhea, nausea and vomiting.  Genitourinary:  Negative for difficulty urinating and hematuria.  Musculoskeletal:  Negative for arthralgias, myalgias and neck pain.  Skin:  Negative for rash.  Neurological:  Negative for dizziness, seizures, syncope and headaches.  Hematological:  Negative for adenopathy. Does not bruise/bleed easily.  Psychiatric/Behavioral:  Negative for dysphoric mood. The patient is not nervous/anxious.    Objective:  BP 120/80   Pulse 77   Temp 97.7 F (36.5 C) (Temporal)   Ht 5\' 7"  (1.702 m)   Wt 149 lb 6 oz (67.8 kg)   SpO2 97%   BMI 23.40 kg/m   Wt Readings from Last 3 Encounters:  04/14/21  149 lb 6 oz (67.8 kg)  04/13/20 149 lb 5 oz (67.7 kg)  10/30/19 148 lb (67.1 kg)      Physical Exam Vitals and nursing note reviewed.  Constitutional:      General: He is not in acute distress.    Appearance: Normal appearance. He is well-developed. He is not ill-appearing.  HENT:     Head: Normocephalic and atraumatic.     Right Ear: Hearing, tympanic membrane, ear canal and external ear normal.     Left Ear: Hearing, tympanic membrane, ear canal and external ear normal.  Eyes:     General: No scleral icterus.    Extraocular Movements: Extraocular movements intact.     Conjunctiva/sclera: Conjunctivae normal.     Pupils: Pupils are equal, round, and reactive to light.  Neck:     Thyroid: No thyroid mass or thyromegaly.  Cardiovascular:     Rate and Rhythm: Normal rate and regular rhythm.     Pulses: Normal pulses.          Radial pulses are 2+ on the right side and 2+ on the left side.     Heart sounds: Normal heart sounds. No murmur heard. Pulmonary:     Effort: Pulmonary effort is normal. No respiratory distress.     Breath sounds: Normal breath sounds. No wheezing, rhonchi or rales.  Abdominal:     General: Bowel sounds are normal. There is no distension.     Palpations: Abdomen is soft. There is no mass.     Tenderness: There is no abdominal tenderness. There is no guarding or rebound.     Hernia: No hernia is present.  Musculoskeletal:        General: Normal range of motion.     Cervical back: Normal range of motion and neck supple.     Right lower leg: No edema.     Left lower leg: No edema.  Lymphadenopathy:     Cervical: No cervical adenopathy.  Skin:    General: Skin is warm and dry.     Findings: No rash.  Neurological:     General: No focal deficit present.     Mental Status: He is alert and oriented to person, place, and time.  Psychiatric:        Mood and Affect: Mood normal.        Behavior: Behavior normal.        Thought Content: Thought content  normal.        Judgment: Judgment normal.      Results for orders placed or performed in visit on 03/31/20  PSA  Result Value Ref Range   PSA 0.84 0.10 - 4.00 ng/mL  Comprehensive metabolic panel  Result Value Ref Range   Sodium 141 135 - 145 mEq/L   Potassium 4.6 3.5 - 5.1 mEq/L   Chloride 103 96 - 112 mEq/L   CO2 33 (H) 19 - 32 mEq/L   Glucose, Bld 93 70 - 99 mg/dL   BUN 12  6 - 23 mg/dL   Creatinine, Ser 0.96 0.40 - 1.50 mg/dL   Total Bilirubin 0.4 0.2 - 1.2 mg/dL   Alkaline Phosphatase 58 39 - 117 U/L   AST 20 0 - 37 U/L   ALT 17 0 - 53 U/L   Total Protein 6.7 6.0 - 8.3 g/dL   Albumin 4.3 3.5 - 5.2 g/dL   GFR 85.46 >60.00 mL/min   Calcium 9.2 8.4 - 10.5 mg/dL  Lipid panel  Result Value Ref Range   Cholesterol 197 0 - 200 mg/dL   Triglycerides 75.0 0.0 - 149.0 mg/dL   HDL 69.80 >39.00 mg/dL   VLDL 15.0 0.0 - 40.0 mg/dL   LDL Cholesterol 112 (H) 0 - 99 mg/dL   Total CHOL/HDL Ratio 3    NonHDL 127.25     Assessment & Plan:  This visit occurred during the SARS-CoV-2 public health emergency.  Safety protocols were in place, including screening questions prior to the visit, additional usage of staff PPE, and extensive cleaning of exam room while observing appropriate contact time as indicated for disinfecting solutions.   Problem List Items Addressed This Visit     Health maintenance examination - Primary (Chronic)    Preventative protocols reviewed and updated unless pt declined. Discussed healthy diet and lifestyle.       GERD (gastroesophageal reflux disease)    Continues pepcid       HLD (hyperlipidemia)    Chronic, mild off meds. Update FLP.  The 10-year ASCVD risk score (Arnett DK, et al., 2019) is: 7.2%   Values used to calculate the score:     Age: 4 years     Sex: Male     Is Non-Hispanic African American: No     Diabetic: No     Tobacco smoker: No     Systolic Blood Pressure: 123456 mmHg     Is BP treated: No     HDL Cholesterol: 69.8 mg/dL     Total  Cholesterol: 197 mg/dL       Relevant Orders   Lipid panel   Comprehensive metabolic panel   Other Visit Diagnoses     Special screening for malignant neoplasm of prostate       Relevant Orders   PSA        No orders of the defined types were placed in this encounter.  Orders Placed This Encounter  Procedures   Lipid panel   Comprehensive metabolic panel   PSA     Patient instructions: Labs today  You are doing well today Return as needed or in 1 year for next physical.   Follow up plan: Return in about 1 year (around 04/14/2022) for annual exam, prior fasting for blood work.  Ria Bush, MD

## 2021-04-14 NOTE — Assessment & Plan Note (Signed)
Preventative protocols reviewed and updated unless pt declined. Discussed healthy diet and lifestyle.  

## 2021-04-14 NOTE — Assessment & Plan Note (Signed)
Chronic, mild off meds. Update FLP.  The 10-year ASCVD risk score (Arnett DK, et al., 2019) is: 7.2%   Values used to calculate the score:     Age: 60 years     Sex: Male     Is Non-Hispanic African American: No     Diabetic: No     Tobacco smoker: No     Systolic Blood Pressure: 120 mmHg     Is BP treated: No     HDL Cholesterol: 69.8 mg/dL     Total Cholesterol: 197 mg/dL

## 2021-04-15 ENCOUNTER — Encounter: Payer: Self-pay | Admitting: Family Medicine

## 2021-04-15 LAB — COMPREHENSIVE METABOLIC PANEL
AG Ratio: 1.8 (calc) (ref 1.0–2.5)
ALT: 20 U/L (ref 9–46)
AST: 24 U/L (ref 10–35)
Albumin: 4.4 g/dL (ref 3.6–5.1)
Alkaline phosphatase (APISO): 61 U/L (ref 35–144)
BUN: 16 mg/dL (ref 7–25)
CO2: 27 mmol/L (ref 20–32)
Calcium: 9.6 mg/dL (ref 8.6–10.3)
Chloride: 99 mmol/L (ref 98–110)
Creat: 0.93 mg/dL (ref 0.70–1.35)
Globulin: 2.5 g/dL (calc) (ref 1.9–3.7)
Glucose, Bld: 84 mg/dL (ref 65–99)
Potassium: 4.4 mmol/L (ref 3.5–5.3)
Sodium: 138 mmol/L (ref 135–146)
Total Bilirubin: 0.7 mg/dL (ref 0.2–1.2)
Total Protein: 6.9 g/dL (ref 6.1–8.1)

## 2021-04-15 LAB — LIPID PANEL
Cholesterol: 221 mg/dL — ABNORMAL HIGH (ref <200)
HDL: 95 mg/dL (ref >=40)
LDL Cholesterol (Calc): 112 mg/dL (calc) — ABNORMAL HIGH
Non-HDL Cholesterol (Calc): 126 mg/dL (calc) (ref <130)
Total CHOL/HDL Ratio: 2.3 (calc) (ref <5.0)
Triglycerides: 57 mg/dL (ref <150)

## 2021-04-15 LAB — PSA: PSA: 0.89 ng/mL (ref <=4.00)

## 2021-04-20 IMAGING — DX DG WRIST COMPLETE 3+V*R*
4 series · 4 of 4 positions shown · non-contrast
Comparison: April 23, 2020

CLINICAL DATA: Prior fracture distal radius

EXAM:
RIGHT WRIST - COMPLETE 3+ VIEW

[wrist pa]
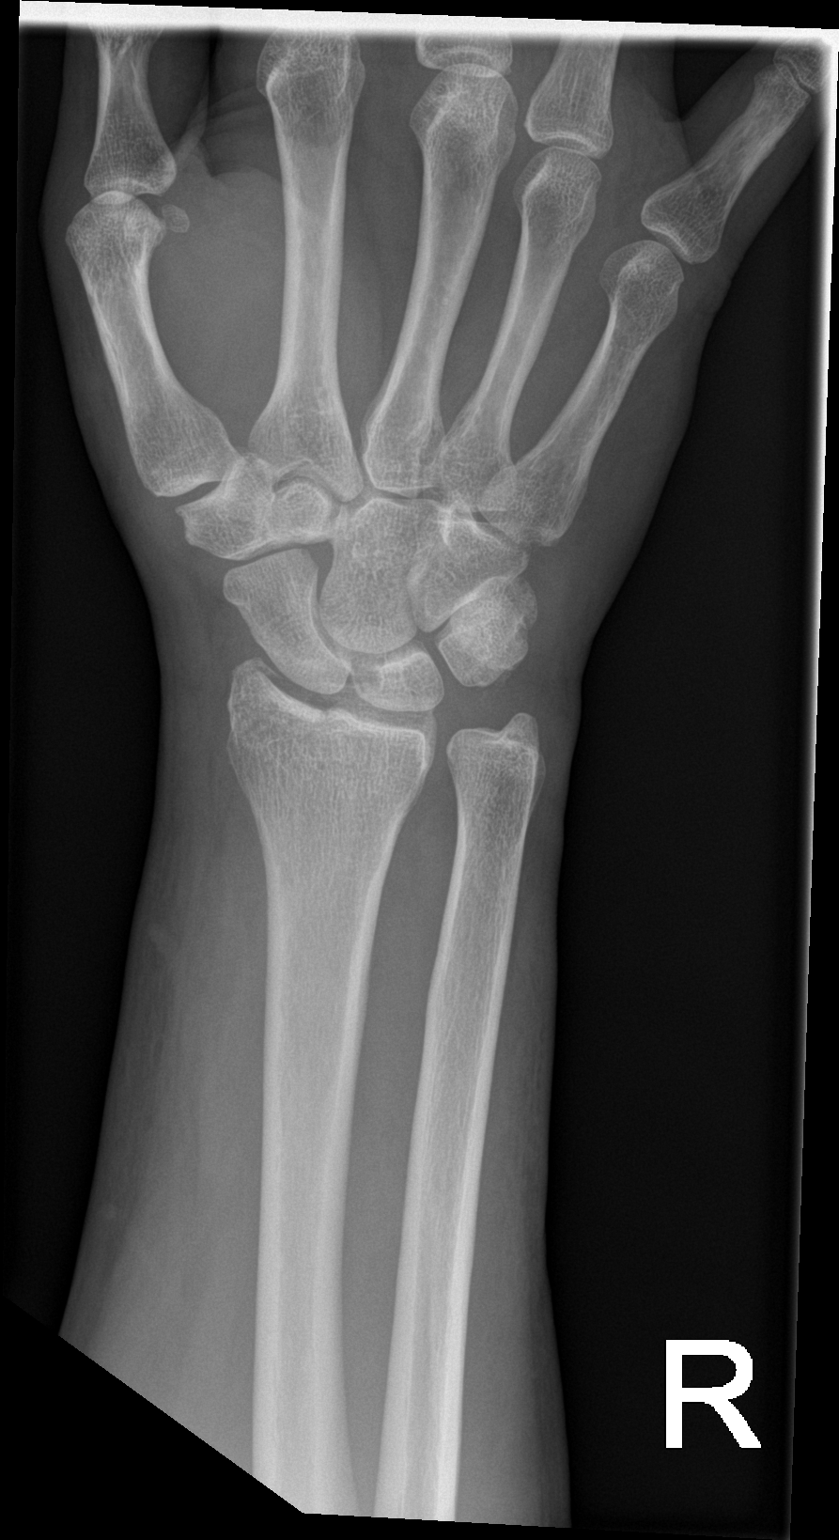

[wrist obl]
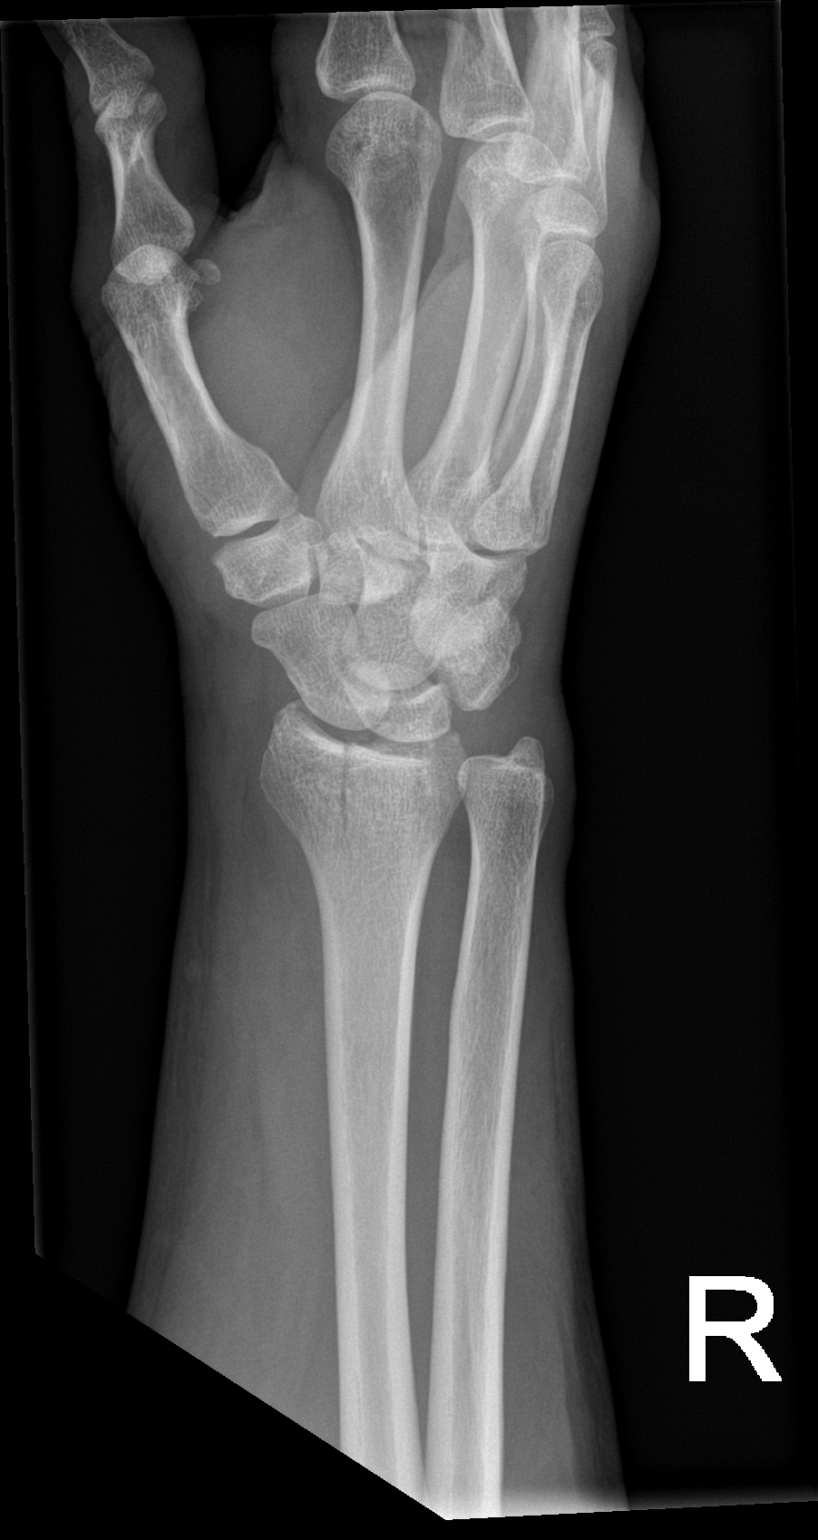

[wrist lat]
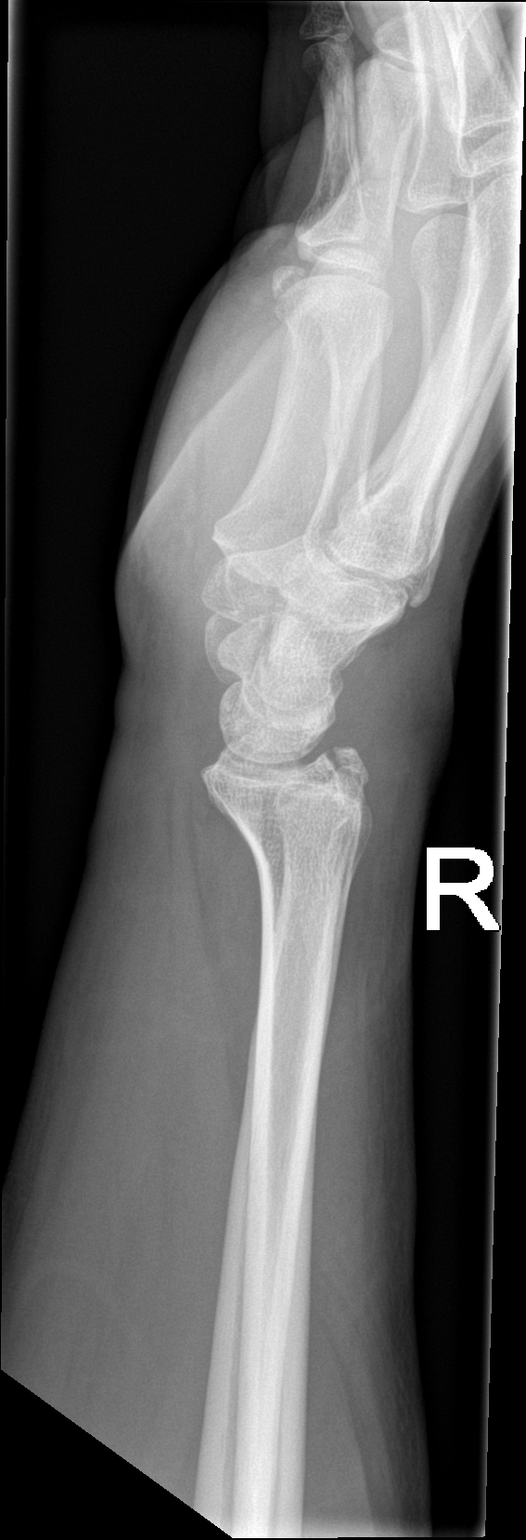

[wrist navicular]
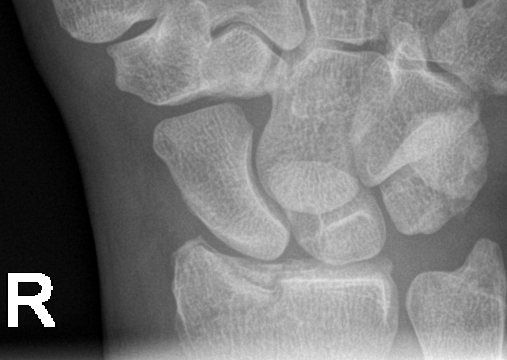

[4 of 4 positions shown; findings below may reference images not displayed]

FINDINGS: Frontal, oblique, lateral, and ulnar deviation scaphoid images were
obtained. There is a stable linear fracture coursing parallel to the
cortex which is seen in the mid distal radial metaphysis and extends
to the radiocarpal articular surface. A fracture more medially shows
mild impaction in this area, stable. There is also a mildly
displaced fracture of the radial styloid. These fractures appear
stable compared to 2 days prior. No new fracture. No dislocation. No
joint space narrowing or erosion.
IMPRESSION: Stable appearing fractures of the distal radius with impaction more
medially and avulsion of the radial styloid more laterally. A linear
fracture which runs parallel to the cortex extends to the articular
surface, stable. No new fracture. No dislocation. No appreciable
underlying arthropathy.

## 2022-03-13 ENCOUNTER — Encounter: Payer: Self-pay | Admitting: Internal Medicine

## 2022-03-13 ENCOUNTER — Ambulatory Visit (INDEPENDENT_AMBULATORY_CARE_PROVIDER_SITE_OTHER): Payer: BC Managed Care – PPO | Admitting: Internal Medicine

## 2022-03-13 VITALS — BP 128/86 | HR 75 | Ht 67.0 in | Wt 150.0 lb

## 2022-03-13 DIAGNOSIS — K219 Gastro-esophageal reflux disease without esophagitis: Secondary | ICD-10-CM | POA: Diagnosis not present

## 2022-03-13 DIAGNOSIS — Z9889 Other specified postprocedural states: Secondary | ICD-10-CM

## 2022-03-13 DIAGNOSIS — Z8719 Personal history of other diseases of the digestive system: Secondary | ICD-10-CM

## 2022-03-13 MED ORDER — PANTOPRAZOLE SODIUM 40 MG PO TBEC: 40.0000 mg | DELAYED_RELEASE_TABLET | Freq: Every day | ORAL | 3 refills | Status: DC

## 2022-03-13 NOTE — Patient Instructions (Signed)
_______________________________________________________  If you are age 63 or older, your body mass index should be between 23-30. Your Body mass index is 23.49 kg/m. If this is out of the aforementioned range listed, please consider follow up with your Primary Care Provider.  If you are age 13 or younger, your body mass index should be between 19-25. Your Body mass index is 23.49 kg/m. If this is out of the aformentioned range listed, please consider follow up with your Primary Care Provider.   ________________________________________________________  The  GI providers would like to encourage you to use [facility] to communicate with providers for non-urgent requests or questions.  Due to long hold times on the telephone, sending your provider a message by [facility] may be a faster and more efficient way to get a response.  Please allow 48 business hours for a response.  Please remember that this is for non-urgent requests.  _______________________________________________________  We have sent the following medications to your pharmacy for you to pick up at your convenience:  Pantoprazole

## 2022-03-13 NOTE — Progress Notes (Signed)
HISTORY OF PRESENT ILLNESS:  Samuel Chapman is a 62 y.o. male with a history of Zenker's diverticulum status post endoscopic Zenker's diverticulectomy April 2017 at [facility].  He also has a history of GERD.  I last saw the patient May 05, 2019 when he underwent routine screening colonoscopy.  The examination was normal.  Follow-up in 10 years recommended.  Patient presents today because he has been having problems with acid reflux more frequently.  Often at night.  He was recently visiting [place] and a cardiologist friend recommended Riopan gel.  He has been using this.  It helps though without sustained effect.  He also was given metoclopramide, which she has not taken.  He is pleased to report that his swallowing is good.  No abdominal pain, weight loss, or other alarm features.  His last upper endoscopy in December 2016 was normal except for the Zenker's diverticulum.  GI review of systems is otherwise negative  REVIEW OF SYSTEMS:  All non-GI ROS negative negative unless otherwise stated in the HPI.  Past Medical History:  Diagnosis Date   Closed die punch fracture distal radius, right, initial encounter 04/25/2020   GERD (gastroesophageal reflux disease)    History of chicken pox    Influenza A 05/22/2018   Internal hemorrhoid    Low BP    post op    RAD (reactive airway disease)    ?of this vs mild asthma   Seasonal allergic rhinitis    tree pollen   Zenker's hypopharyngeal diverticulum 04/2015    Past Surgical History:  Procedure Laterality Date   COLONOSCOPY  01/20/2009   int hem, rpt 10 yrs [facility])   COLONOSCOPY  04/2019   WNL rpt 10 yrs [facility])   ESOPHAGOGASTRODUODENOSCOPY  2016   mod zenker's diverticulum, referred to tertiary center [facility])   [place] DIVERTICULECTOMY ENDOSCOPIC  08/2015   Dr Harl Bowie at [facility]  reports that he has never smoked. He has never used smokeless tobacco. He reports current alcohol use of  about 21.0 standard drinks of alcohol per week. He reports that he does not use drugs.  family history includes Aneurysm in his father; CAD in his maternal grandfather; Diabetes in his brother; Lung cancer (age of onset: 75) in his mother; Stroke (age of onset: 64) in his father; Ulcers (age of onset: 38) in his father.  Allergies  Allergen Reactions   Bee Venom Swelling       PHYSICAL EXAMINATION: Vital signs: BP 128/86   Pulse 75   Ht 5\' 7"  (1.702 m)   Wt 150 lb (68 kg)   BMI 23.49 kg/m   Constitutional: generally well-appearing, no acute distress Psychiatric: alert and oriented x3, cooperative Eyes: extraocular movements intact, anicteric, conjunctiva pink Mouth: oral pharynx moist, no lesions Neck: supple no lymphadenopathy Cardiovascular: heart regular rate and rhythm, no murmur Lungs: clear to auscultation bilaterally Abdomen: soft, nontender, nondistended, no obvious ascites, no peritoneal signs, normal bowel sounds, no organomegaly Rectal: Omitted Extremities: no clubbing, cyanosis, or lower extremity edema bilaterally Skin: no lesions on visible extremities Neuro: No focal deficits.  Cranial intact  ASSESSMENT:  1.  Chronic GERD.  Recently given an acids which are transiently effective. 2.  Zenker's diverticulum status post successful endoscopic diverticulectomy 2017 3.  Normal screening colonoscopy 2010 and 2020   PLAN:  1.  Prescribe pantoprazole 40 mg daily.  Medication risks reviewed 2.  Reflux precautions 3.  Screening colonoscopy  2030 4.  GI office follow-up 3 months Total time of 30 minutes was spent preparing to see the patient, reviewing data, obtaining comprehensive history, performing medically appropriate physical examination, counseling and educating the patient regarding the above listed issues, ordering medication, arranging follow-up, and documenting clinical information in the health record

## 2022-03-15 ENCOUNTER — Other Ambulatory Visit (HOSPITAL_COMMUNITY): Payer: Self-pay

## 2022-03-15 ENCOUNTER — Telehealth: Payer: Self-pay | Admitting: Pharmacy Technician

## 2022-03-15 NOTE — Telephone Encounter (Signed)
Patient Advocate Encounter  Prior Authorization for PANTOPRAZOLE 40MG  has been approved.    PA# --- Effective dates: 11.2.23 through 10.31.24  Abdimalik Mayorquin B. CPhT P: [phone] F: [phone]   Received notification from [place] that prior authorization for PANTOPRAZOLE 40MG  is required.   PA submitted on 11.2.23 Key [id number]  Status is pending    Luciano Cutter, CPhT Patient Advocate Phone: [phone]

## 2022-03-15 NOTE — Telephone Encounter (Signed)
PA has been submitted and approved. [employer] can allow up to 72hrs before it can be processed at pharmacy level. Unable to do test billing with insurance.

## 2022-04-08 ENCOUNTER — Other Ambulatory Visit: Payer: Self-pay | Admitting: Family Medicine

## 2022-04-08 DIAGNOSIS — Z125 Encounter for screening for malignant neoplasm of prostate: Secondary | ICD-10-CM

## 2022-04-08 DIAGNOSIS — E785 Hyperlipidemia, unspecified: Secondary | ICD-10-CM

## 2022-04-09 ENCOUNTER — Other Ambulatory Visit (INDEPENDENT_AMBULATORY_CARE_PROVIDER_SITE_OTHER): Payer: BC Managed Care – PPO

## 2022-04-09 DIAGNOSIS — Z125 Encounter for screening for malignant neoplasm of prostate: Secondary | ICD-10-CM | POA: Diagnosis not present

## 2022-04-09 DIAGNOSIS — E785 Hyperlipidemia, unspecified: Secondary | ICD-10-CM | POA: Diagnosis not present

## 2022-04-09 LAB — LIPID PANEL
Cholesterol: 220 mg/dL — ABNORMAL HIGH (ref 0–200)
HDL: 86.1 mg/dL (ref >39.00)
LDL Cholesterol: 121 mg/dL — ABNORMAL HIGH (ref 0–99)
NonHDL: 134.05
Total CHOL/HDL Ratio: 3
Triglycerides: 67 mg/dL (ref 0.0–149.0)
VLDL: 13.4 mg/dL (ref 0.0–40.0)

## 2022-04-09 LAB — COMPREHENSIVE METABOLIC PANEL
ALT: 19 U/L (ref 0–53)
AST: 20 U/L (ref 0–37)
Albumin: 4.5 g/dL (ref 3.5–5.2)
Alkaline Phosphatase: 67 U/L (ref 39–117)
BUN: 16 mg/dL (ref 6–23)
CO2: 31 mEq/L (ref 19–32)
Calcium: 9.7 mg/dL (ref 8.4–10.5)
Chloride: 98 mEq/L (ref 96–112)
Creatinine, Ser: 0.92 mg/dL (ref 0.40–1.50)
GFR: 88.66 mL/min (ref >60.00)
Glucose, Bld: 90 mg/dL (ref 70–99)
Potassium: 4.5 mEq/L (ref 3.5–5.1)
Sodium: 136 mEq/L (ref 135–145)
Total Bilirubin: 0.4 mg/dL (ref 0.2–1.2)
Total Protein: 7.3 g/dL (ref 6.0–8.3)

## 2022-04-09 LAB — PSA: PSA: 1.18 ng/mL (ref 0.10–4.00)

## 2022-04-09 LAB — TSH: TSH: 1.6 u[IU]/mL (ref 0.35–5.50)

## 2022-04-16 ENCOUNTER — Ambulatory Visit (INDEPENDENT_AMBULATORY_CARE_PROVIDER_SITE_OTHER): Payer: BC Managed Care – PPO | Admitting: Family Medicine

## 2022-04-16 ENCOUNTER — Encounter: Payer: Self-pay | Admitting: Family Medicine

## 2022-04-16 VITALS — BP 120/78 | HR 87 | Temp 97.3°F | Ht 66.5 in | Wt 149.4 lb

## 2022-04-16 DIAGNOSIS — E785 Hyperlipidemia, unspecified: Secondary | ICD-10-CM

## 2022-04-16 DIAGNOSIS — Z9889 Other specified postprocedural states: Secondary | ICD-10-CM

## 2022-04-16 DIAGNOSIS — Z0000 Encounter for general adult medical examination without abnormal findings: Secondary | ICD-10-CM | POA: Diagnosis not present

## 2022-04-16 DIAGNOSIS — K219 Gastro-esophageal reflux disease without esophagitis: Secondary | ICD-10-CM | POA: Diagnosis not present

## 2022-04-16 DIAGNOSIS — Z8719 Personal history of other diseases of the digestive system: Secondary | ICD-10-CM

## 2022-04-16 MED ORDER — VITAMIN D3 25 MCG (1000 UT) PO CAPS: 1.0000 | ORAL_CAPSULE | Freq: Every day | ORAL | Status: AC

## 2022-04-16 NOTE — Assessment & Plan Note (Signed)
Preventative protocols reviewed and updated unless pt declined. Discussed healthy diet and lifestyle.  

## 2022-04-16 NOTE — Assessment & Plan Note (Signed)
Chronic, doing better on daily PPI - has GI f/u planned soon.

## 2022-04-16 NOTE — Progress Notes (Signed)
Patient ID: Samuel Chapman, male    DOB: February 08, 1961, 61 y.o.   MRN: [id number]  This visit was conducted in person.  BP 120/78   Pulse 87   Temp (!) 97.3 F (36.3 C) (Temporal)   Ht 5' 6.5" (1.689 m)   Wt 149 lb 6.4 oz (67.8 kg)   SpO2 97%   BMI 23.75 kg/m    CC: CPE Subjective:   HPI: Samuel Chapman is a 61 y.o. male presenting on 04/16/2022 for Annual Exam    Zencker's diverticulum s/p diverticulectomy, intraluminal approach at [facility] 08/2015. Sees GI yearly [name]). Now on pantoprazole 40mg  daily - and notes significant improvement.    Preventative: COLONOSCOPY 04/2019 - WNL rpt 10 yrs 05/2019)  Prostate cancer screening - nocturia x1. Yearly screen.  Lung cancer screening - not eligible  Flu yearly  COVID vaccines Moderna 07/2199 x2, booster 01/2020, bivalent booster 02/2021, 02/2022 Tdap 2016 RSV - 02/2022 Shingrix - 04/2020, 06/2020 Seat belt use discussed  Sunscreen use discussed, no changing moles on skin.  Sleep - averaging 8 hours/night Non-smoker Alcohol - 2-3 glasses of wine/day.  Dentist regularly - had implant this year  Eye exam - yearly watching early cataracts    Lives with husband 07/2020) Edu: university Occupation: works at [employer] Activity: gym with trainer twice weekly, pilates, walks dog daily, tennis twice weekly  Diet: good water, fruits/vegetables daily      Relevant past medical, surgical, family and social history reviewed and updated as indicated. Interim medical history since our last visit reviewed. Allergies and medications reviewed and updated. Outpatient Medications Prior to Visit  Medication Sig Dispense Refill   loratadine (CLARITIN) 10 MG tablet Take 10 mg by mouth daily as needed. As needed for seasonal allergies     Multiple Vitamin (MULTIVITAMIN) tablet Take 1 tablet by mouth daily.     pantoprazole (PROTONIX) 40 MG tablet Take 1 tablet (40 mg total) by mouth daily. 90 tablet 3   Cholecalciferol (VITAMIN D3 PO) Take by  mouth daily.     Ascorbic Acid (VITAMIN C PO) Take by mouth daily.     famotidine (PEPCID) 10 MG tablet Take 10 mg by mouth daily as needed for heartburn or indigestion. Alternates with Pepcid Maximum     famotidine (PEPCID) 20 MG tablet Take 20 mg by mouth daily as needed for heartburn or indigestion. Alternates with Pepcid Original     Magaldrate-Simethicone (RIOPAN PLUS 2 PO) Take by mouth. Riopan 80 mg 1 a day     No facility-administered medications prior to visit.     Per HPI unless specifically indicated in ROS section below Review of Systems  Constitutional:  Negative for activity change, appetite change, chills, fatigue, fever and unexpected weight change.  HENT:  Negative for hearing loss.   Eyes:  Negative for visual disturbance.  Respiratory:  Negative for cough, chest tightness, shortness of breath and wheezing.   Cardiovascular:  Negative for chest pain, palpitations and leg swelling.  Gastrointestinal:  Negative for abdominal distention, abdominal pain, blood in stool, constipation, diarrhea, nausea and vomiting.  Genitourinary:  Negative for difficulty urinating and hematuria.  Musculoskeletal:  Negative for arthralgias, myalgias and neck pain.  Skin:  Negative for rash.  Neurological:  Negative for dizziness, seizures, syncope and headaches.  Hematological:  Negative for adenopathy. Does not bruise/bleed easily.  Psychiatric/Behavioral:  Negative for dysphoric mood. The patient is not nervous/anxious.     Objective:  BP 120/78   Pulse 87  Temp (!) 97.3 F (36.3 C) (Temporal)   Ht 5' 6.5" (1.689 m)   Wt 149 lb 6.4 oz (67.8 kg)   SpO2 97%   BMI 23.75 kg/m   Wt Readings from Last 3 Encounters:  04/16/22 149 lb 6.4 oz (67.8 kg)  03/13/22 150 lb (68 kg)  04/14/21 149 lb 6 oz (67.8 kg)      Physical Exam Vitals and nursing note reviewed.  Constitutional:      General: He is not in acute distress.    Appearance: Normal appearance. He is well-developed. He is  not ill-appearing.  HENT:     Head: Normocephalic and atraumatic.     Right Ear: Hearing, tympanic membrane, ear canal and external ear normal.     Left Ear: Hearing, tympanic membrane, ear canal and external ear normal.     Nose: Nose normal.     Mouth/Throat:     Mouth: Mucous membranes are moist.     Pharynx: Oropharynx is clear. No oropharyngeal exudate or posterior oropharyngeal erythema.  Eyes:     General: No scleral icterus.    Extraocular Movements: Extraocular movements intact.     Conjunctiva/sclera: Conjunctivae normal.     Pupils: Pupils are equal, round, and reactive to light.  Neck:     Thyroid: No thyroid mass or thyromegaly.     Vascular: No carotid bruit.  Cardiovascular:     Rate and Rhythm: Normal rate and regular rhythm.     Pulses: Normal pulses.          Radial pulses are 2+ on the right side and 2+ on the left side.     Heart sounds: Normal heart sounds. No murmur heard. Pulmonary:     Effort: Pulmonary effort is normal. No respiratory distress.     Breath sounds: Normal breath sounds. No wheezing, rhonchi or rales.  Abdominal:     General: Bowel sounds are normal. There is no distension.     Palpations: Abdomen is soft. There is no mass.     Tenderness: There is no abdominal tenderness. There is no guarding or rebound.     Hernia: No hernia is present.  Musculoskeletal:        General: Normal range of motion.     Cervical back: Normal range of motion and neck supple.     Right lower leg: No edema.     Left lower leg: No edema.  Lymphadenopathy:     Cervical: No cervical adenopathy.  Skin:    General: Skin is warm and dry.     Findings: No rash.  Neurological:     General: No focal deficit present.     Mental Status: He is alert and oriented to person, place, and time.  Psychiatric:        Mood and Affect: Mood normal.        Behavior: Behavior normal.        Thought Content: Thought content normal.        Judgment: Judgment normal.        Results for orders placed or performed in visit on 04/09/22  PSA  Result Value Ref Range   PSA 1.18 0.10 - 4.00 ng/mL  TSH  Result Value Ref Range   TSH 1.60 0.35 - 5.50 uIU/mL  Comprehensive metabolic panel  Result Value Ref Range   Sodium 136 135 - 145 mEq/L   Potassium 4.5 3.5 - 5.1 mEq/L   Chloride 98 96 - 112 mEq/L   CO2  31 19 - 32 mEq/L   Glucose, Bld 90 70 - 99 mg/dL   BUN 16 6 - 23 mg/dL   Creatinine, Ser 3.89 0.40 - 1.50 mg/dL   Total Bilirubin 0.4 0.2 - 1.2 mg/dL   Alkaline Phosphatase 67 39 - 117 U/L   AST 20 0 - 37 U/L   ALT 19 0 - 53 U/L   Total Protein 7.3 6.0 - 8.3 g/dL   Albumin 4.5 3.5 - 5.2 g/dL   GFR 37.34 >28.76 mL/min   Calcium 9.7 8.4 - 10.5 mg/dL  Lipid panel  Result Value Ref Range   Cholesterol 220 (H) 0 - 200 mg/dL   Triglycerides 81.1 0.0 - 149.0 mg/dL   HDL 57.26 >20.35 mg/dL   VLDL 59.7 0.0 - 41.6 mg/dL   LDL Cholesterol 384 (H) 0 - 99 mg/dL   Total CHOL/HDL Ratio 3    NonHDL 134.05     Assessment & Plan:   Problem List Items Addressed This Visit       Unprioritized   Health maintenance examination - Primary (Chronic)    Preventative protocols reviewed and updated unless pt declined. Discussed healthy diet and lifestyle.       GERD (gastroesophageal reflux disease)    Chronic, doing better on daily PPI - has GI f/u planned soon.       HLD (hyperlipidemia)    Chronic, mild off meds. The 10-year ASCVD risk score (Arnett DK, et al., 2019) is: 7.5%   Values used to calculate the score:     Age: 37 years     Sex: Male     Is Non-Hispanic African American: No     Diabetic: No     Tobacco smoker: No     Systolic Blood Pressure: 120 mmHg     Is BP treated: No     HDL Cholesterol: 86.1 mg/dL     Total Cholesterol: 220 mg/dL d      History of excision of Zenker's diverticulum     Meds ordered this encounter  Medications   Cholecalciferol (VITAMIN D3) 25 MCG (1000 UT) CAPS    Sig: Take 1 capsule (1,000 Units total) by mouth  daily.    Dispense:  30 capsule   No orders of the defined types were placed in this encounter.    Patient instructions: You are doing well today Return as needed or in 1 year for next physical   Follow up plan: Return in about 1 year (around 04/17/2023) for annual exam, prior fasting for blood work.  Eustaquio Boyden, MD

## 2022-04-16 NOTE — Patient Instructions (Addendum)
You are doing well today Return as needed or in 1 year for next physical   Health Maintenance, Male Adopting a healthy lifestyle and getting preventive care are important in promoting health and wellness. Ask your health care provider about: The right schedule for you to have regular tests and exams. Things you can do on your own to prevent diseases and keep yourself healthy. What should I know about diet, weight, and exercise? Eat a healthy diet  Eat a diet that includes plenty of vegetables, fruits, low-fat dairy products, and lean protein. Do not eat a lot of foods that are high in solid fats, added sugars, or sodium. Maintain a healthy weight Body mass index (BMI) is a measurement that can be used to identify possible weight problems. It estimates body fat based on height and weight. Your health care provider can help determine your BMI and help you achieve or maintain a healthy weight. Get regular exercise Get regular exercise. This is one of the most important things you can do for your health. Most adults should: Exercise for at least 150 minutes each week. The exercise should increase your heart rate and make you sweat (moderate-intensity exercise). Do strengthening exercises at least twice a week. This is in addition to the moderate-intensity exercise. Spend less time sitting. Even light physical activity can be beneficial. Watch cholesterol and blood lipids Have your blood tested for lipids and cholesterol at 63 years of age, then have this test every 5 years. You may need to have your cholesterol levels checked more often if: Your lipid or cholesterol levels are high. You are older than 63 years of age. You are at high risk for heart disease. What should I know about cancer screening? Many types of cancers can be detected early and may often be prevented. Depending on your health history and family history, you may need to have cancer screening at various ages. This may include  screening for: Colorectal cancer. Prostate cancer. Skin cancer. Lung cancer. What should I know about heart disease, diabetes, and high blood pressure? Blood pressure and heart disease High blood pressure causes heart disease and increases the risk of stroke. This is more likely to develop in people who have high blood pressure readings or are overweight. Talk with your health care provider about your target blood pressure readings. Have your blood pressure checked: Every 3-5 years if you are 12-76 years of age. Every year if you are 49 years old or older. If you are between the ages of 39 and 29 and are a current or former smoker, ask your health care provider if you should have a one-time screening for abdominal aortic aneurysm (AAA). Diabetes Have regular diabetes screenings. This checks your fasting blood sugar level. Have the screening done: Once every three years after age 19 if you are at a normal weight and have a low risk for diabetes. More often and at a younger age if you are overweight or have a high risk for diabetes. What should I know about preventing infection? Hepatitis B If you have a higher risk for hepatitis B, you should be screened for this virus. Talk with your health care provider to find out if you are at risk for hepatitis B infection. Hepatitis C Blood testing is recommended for: Everyone born from [birth year] through 1967. Anyone with known risk factors for hepatitis C. Sexually transmitted infections (STIs) You should be screened each year for STIs, including gonorrhea and chlamydia, if: You are sexually active and  are younger than 63 years of age. You are older than 63 years of age and your health care provider tells you that you are at risk for this type of infection. Your sexual activity has changed since you were last screened, and you are at increased risk for chlamydia or gonorrhea. Ask your health care provider if you are at risk. Ask your health care  provider about whether you are at high risk for HIV. Your health care provider may recommend a prescription medicine to help prevent HIV infection. If you choose to take medicine to prevent HIV, you should first get tested for HIV. You should then be tested every 3 months for as long as you are taking the medicine. Follow these instructions at home: Alcohol use Do not drink alcohol if your health care provider tells you not to drink. If you drink alcohol: Limit how much you have to 0-2 drinks a day. Know how much alcohol is in your drink. In the [place] one drink equals one 12 oz bottle of beer (355 mL), one 5 oz glass of wine (148 mL), or one 1 oz glass of hard liquor (44 mL). Lifestyle Do not use any products that contain nicotine or tobacco. These products include cigarettes, chewing tobacco, and vaping devices, such as e-cigarettes. If you need help quitting, ask your health care provider. Do not use street drugs. Do not share needles. Ask your health care provider for help if you need support or information about quitting drugs. General instructions Schedule regular health, dental, and eye exams. Stay current with your vaccines. Tell your health care provider if: You often feel depressed. You have ever been abused or do not feel safe at home. Summary Adopting a healthy lifestyle and getting preventive care are important in promoting health and wellness. Follow your health care provider's instructions about healthy diet, exercising, and getting tested or screened for diseases. Follow your health care provider's instructions on monitoring your cholesterol and blood pressure. This information is not intended to replace advice given to you by your health care provider. Make sure you discuss any questions you have with your health care provider. Document Revised: 09/19/2020 Document Reviewed: 09/19/2020 Elsevier Patient Education  [place].

## 2022-04-16 NOTE — Assessment & Plan Note (Signed)
Chronic, mild off meds. The 10-year ASCVD risk score (Arnett DK, et al., 2019) is: 7.5%   Values used to calculate the score:     Age: 61 years     Sex: Male     Is Non-Hispanic African American: No     Diabetic: No     Tobacco smoker: No     Systolic Blood Pressure: 120 mmHg     Is BP treated: No     HDL Cholesterol: 86.1 mg/dL     Total Cholesterol: 220 mg/dL d

## 2022-06-13 ENCOUNTER — Ambulatory Visit (INDEPENDENT_AMBULATORY_CARE_PROVIDER_SITE_OTHER): Payer: BC Managed Care – PPO | Admitting: Internal Medicine

## 2022-06-13 ENCOUNTER — Encounter: Payer: Self-pay | Admitting: Internal Medicine

## 2022-06-13 VITALS — BP 114/80 | HR 70 | Ht 66.5 in | Wt 152.0 lb

## 2022-06-13 DIAGNOSIS — K219 Gastro-esophageal reflux disease without esophagitis: Secondary | ICD-10-CM

## 2022-06-13 DIAGNOSIS — Z9889 Other specified postprocedural states: Secondary | ICD-10-CM | POA: Diagnosis not present

## 2022-06-13 DIAGNOSIS — Z8719 Personal history of other diseases of the digestive system: Secondary | ICD-10-CM | POA: Diagnosis not present

## 2022-06-13 MED ORDER — PANTOPRAZOLE SODIUM 40 MG PO TBEC: 40.0000 mg | DELAYED_RELEASE_TABLET | Freq: Every day | ORAL | 3 refills | Status: DC

## 2022-06-13 NOTE — Progress Notes (Signed)
HISTORY OF PRESENT ILLNESS:  Samuel Chapman is a 62 y.o. male with a history of Zenker's diverticulum status post Zenker's diverticulectomy April 2017 at [facility].  He also has a history of GERD.  Last seen in this office 2/30 first 2023 regarding worsening GERD symptoms.  See that dictation.  He was prescribed pantoprazole 40 mg daily and follows up at this time.  He is pleased to report that the medication has been very effective.  No significant reflux symptoms.  Rare breakthrough with dietary indiscretion for which an acid helps.  No dysphagia.  Tolerating medication well.  Does have questions regarding long-term medication use.  No new complaints.  Blood work from April 09, 2022 shows normal comprehensive metabolic panel and normal TSH.  REVIEW OF SYSTEMS:  All non-GI ROS negative unless otherwise stated in the HPI   Past Medical History:  Diagnosis Date   Closed die punch fracture distal radius, right, initial encounter 04/25/2020   GERD (gastroesophageal reflux disease)    History of chicken pox    Influenza A 05/22/2018   Internal hemorrhoid    Low BP    post op    RAD (reactive airway disease)    ?of this vs mild asthma   Seasonal allergic rhinitis    tree pollen   Zenker's hypopharyngeal diverticulum 04/2015    Past Surgical History:  Procedure Laterality Date   COLONOSCOPY  01/20/2009   int hem, rpt 10 yrs [facility])   COLONOSCOPY  04/2019   WNL rpt 10 yrs [facility])   ESOPHAGOGASTRODUODENOSCOPY  2016   mod zenker's diverticulum, referred to tertiary center [facility])   [place] DIVERTICULECTOMY ENDOSCOPIC  08/2015   Dr Harl Bowie at [facility]  reports that he has never smoked. He has never used smokeless tobacco. He reports current alcohol use of about 21.0 standard drinks of alcohol per week. He reports that he does not use drugs.  family history includes Aneurysm in his father; CAD in his maternal grandfather; Diabetes in  his brother; Lung cancer (age of onset: 50) in his mother; Stroke (age of onset: 51) in his father; Ulcers (age of onset: 23) in his father.  Allergies  Allergen Reactions   Bee Venom Swelling       PHYSICAL EXAMINATION: Vital signs: BP 114/80   Pulse 70   Ht 5' 6.5" (1.689 m)   Wt 152 lb (68.9 kg)   BMI 24.17 kg/m   Constitutional: generally well-appearing, no acute distress Psychiatric: alert and oriented x3, cooperative Eyes: Anicteric Abdomen: Not reexamined Extremities: no abnormalities of visible Skin: no lesions on visible extremities Neuro: Grossly intact  ASSESSMENT:  1.  GERD.  Being effectively controlled with pantoprazole 40 mg daily 2.  History of Zenker's diverticulum status post successful endoscopic diverticulectomy 2017 3.  Normal screening colonoscopy 2010 and 2020   PLAN:  1.  Reflux precautions 2.  Continue pantoprazole 40 mg daily 3.  Prescribe pantoprazole 40 mg daily.  Medication risks reviewed 4.  Routine office follow-up 1 year.  Contact the office in the interim for any questions or problems 5.  Routine repeat screening colonoscopy 2030

## 2022-06-13 NOTE — Patient Instructions (Signed)
We have sent the following medications to your pharmacy for you to pick up at your convenience: Pantoprazole   _______________________________________________________  If your blood pressure at your visit was 140/90 or greater, please contact your primary care physician to follow up on this.  _______________________________________________________  If you are age 64 or older, your body mass index should be between 23-30. Your Body mass index is 24.17 kg/m. If this is out of the aforementioned range listed, please consider follow up with your Primary Care Provider.  If you are age 81 or younger, your body mass index should be between 19-25. Your Body mass index is 24.17 kg/m. If this is out of the aformentioned range listed, please consider follow up with your Primary Care Provider.   ________________________________________________________  The [facility] providers would like to encourage you to use [facility] to communicate with providers for non-urgent requests or questions.  Due to long hold times on the telephone, sending your provider a message by [facility] may be a faster and more efficient way to get a response.  Please allow 48 business hours for a response.  Please remember that this is for non-urgent requests.  _______________________________________________________  Thank you for choosing me and [facility].

## 2023-01-31 DIAGNOSIS — H2513 Age-related nuclear cataract, bilateral: Secondary | ICD-10-CM | POA: Diagnosis not present

## 2023-01-31 DIAGNOSIS — H52203 Unspecified astigmatism, bilateral: Secondary | ICD-10-CM | POA: Diagnosis not present

## 2023-01-31 DIAGNOSIS — H25012 Cortical age-related cataract, left eye: Secondary | ICD-10-CM | POA: Diagnosis not present

## 2023-03-18 ENCOUNTER — Ambulatory Visit (INDEPENDENT_AMBULATORY_CARE_PROVIDER_SITE_OTHER): Payer: BC Managed Care – PPO | Admitting: Physician Assistant

## 2023-03-18 ENCOUNTER — Encounter: Payer: Self-pay | Admitting: Physician Assistant

## 2023-03-18 VITALS — BP 112/80 | HR 81 | Ht 68.0 in | Wt 149.5 lb

## 2023-03-18 DIAGNOSIS — K219 Gastro-esophageal reflux disease without esophagitis: Secondary | ICD-10-CM

## 2023-03-18 DIAGNOSIS — Z8719 Personal history of other diseases of the digestive system: Secondary | ICD-10-CM | POA: Diagnosis not present

## 2023-03-18 DIAGNOSIS — Z9889 Other specified postprocedural states: Secondary | ICD-10-CM

## 2023-03-18 MED ORDER — FAMOTIDINE 20 MG PO TABS: 20.0000 mg | ORAL_TABLET | Freq: Every day | ORAL | 4 refills | Status: DC

## 2023-03-18 MED ORDER — PANTOPRAZOLE SODIUM 40 MG PO TBEC: 40.0000 mg | DELAYED_RELEASE_TABLET | Freq: Every day | ORAL | 4 refills | Status: DC

## 2023-03-18 NOTE — Progress Notes (Signed)
Subjective:    Patient ID: Samuel Chapman, male    DOB: 1961/03/13, 62 y.o.   MRN: [id number]  HPI Samuel Chapman is a pleasant 62 year old male, established with Dr. [name].  He was last seen in the office in January 2024 and at that time was seen for follow-up of chronic GERD and was doing well on Protonix 40 mg daily. He is up-to-date with colonoscopy last done in 2020, this was a negative exam and is indicated for follow-up in 2030.  Does have history of a Zenker's diverticulum which was found at EGD in 2016, exam was otherwise normal at that time. He underwent Zenker's diverticulectomy in April 2017 at [facility].  He comes in today concerned that he has noticed more frequent symptoms over the past few months with regurgitation of primarily fluid up into his mouth after lying down at night or sometimes after lying down for a nap if he has eaten shortly before that. He is not regurgitating any food and says that his symptoms do not feel at all similar to the Zenker's diverticulum type symptoms.  He is not having any dysphagia or odynophagia.  No heartburn or indigestion.  Symptoms will manifest with coughing, and bringing up a small amount of fluid.  Review of Systems Pertinent positive and negative review of systems were noted in the above HPI section.  All other review of systems was otherwise negative.   Outpatient Encounter Medications as of 03/18/2023  Medication Sig   [DISCONTINUED] famotidine (PEPCID) 20 MG tablet Take 20 mg by mouth as needed.   Cholecalciferol (VITAMIN D3) 25 MCG (1000 UT) CAPS Take 1 capsule (1,000 Units total) by mouth daily.   famotidine (PEPCID) 20 MG tablet Take 1 tablet (20 mg total) by mouth at bedtime.   loratadine (CLARITIN) 10 MG tablet Take 10 mg by mouth daily as needed. As needed for seasonal allergies   Multiple Vitamin (MULTIVITAMIN) tablet Take 1 tablet by mouth daily.   pantoprazole (PROTONIX) 40 MG tablet Take 1 tablet (40 mg total) by mouth daily before  breakfast.   [DISCONTINUED] pantoprazole (PROTONIX) 40 MG tablet Take 1 tablet (40 mg total) by mouth daily.   No facility-administered encounter medications on file as of 03/18/2023.   Allergies  Allergen Reactions   Bee Venom Swelling   Patient Active Problem List   Diagnosis Date Noted   Cough 12/28/2020   Hearing loss, left 01/03/2018   History of excision of Zenker's diverticulum 12/29/2015   HLD (hyperlipidemia) 12/25/2015   Health maintenance examination 12/27/2014   GERD (gastroesophageal reflux disease) 08/16/2014   Internal hemorrhoid 08/16/2014   Seasonal allergic rhinitis    Social History   Socioeconomic History   Marital status: Married    Spouse name: Not on file   Number of children: 0   Years of education: Not on file   Highest education level: Not on file  Occupational History   Occupation: semi retired   Occupation: retired  Tobacco Use   Smoking status: Never   Smokeless tobacco: Never  Vaping Use   Vaping status: Never Used  Substance and Sexual Activity   Alcohol use: Yes    Alcohol/week: 21.0 standard drinks of alcohol    Types: 21 Standard drinks or equivalent per week    Comment: 3 glasses wine /day   Drug use: No   Sexual activity: Yes    Partners: Male  Other Topics Concern   Not on file  Social History Narrative   Lives with husband (  [employer])   Edu: university   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 Applegate family history includes Aneurysm in his father; CAD in his maternal grandfather; Diabetes in his brother; Lung cancer (age of onset: 82) in his  mother; Stroke (age of onset: 71) in his father; Ulcers (age of onset: 4) in his father.      Objective:    Vitals:   03/18/23 1128  BP: 112/80  Pulse: 81    Physical Exam Well-developed well-nourished older male in no acute distress.  Height, Weight, 149 BMI 22.7  HEENT; nontraumatic, normocephalic Extremities; no clubbing cyanosis or edema skin warm and dry Neuro/Psych; alert and oriented x4, grossly nonfocal mood and affect appropriate        Assessment & Plan:   #46 62 year old white male with history of chronic GERD, and history of Zenker's diverticulum for which he underwent Zenker's diverticulectomy in 2017 done at [facility]. Patient comes in today with concerns that he has had some nocturnal symptoms and symptoms when lying flat for naps with coughing and sour brash type symptoms bringing up fluid into his mouth.  No dysphagia or odynophagia, no regurgitation of food, no early satiety..  Symptoms are consistent with acid reflux/sour brash, rule out development of hiatal hernia, incompetent LES  #2 colon cancer screening-up-to-date due for follow-up colonoscopy 2030  Plan; continue Protonix 40 mg p.o. every morning AC breakfast Add famotidine 20 mg p.o. nightly We discussed importance of n.p.o. for at least 3 hours prior to bedtime, and elevation of the head of the bed 45 degrees for sleep.  Similarly, for daytime symptoms nap with back elevated 45 degrees.  He was also given other antireflux regimen regarding dietary/food triggers.  Will schedule for barium swallow with tablet.  Further recommendations pending results of above.  Chananya Canizalez Oswald Hillock PA-C 03/18/2023   Cc: Eustaquio Boyden, MD

## 2023-03-18 NOTE — Patient Instructions (Signed)
We have sent the following medications to your pharmacy for you to pick up at your convenience: Pantoprazole and famotidine  We have provided you with anti-reflux measures sheet to read today.  You have been scheduled for a Barium Esophogram at [facility] (1st floor of the hospital) on 03/25/2023 at 11:00am. Please arrive 30 minutes prior to your appointment for registration. Make certain not to have anything to eat or drink 3 hours prior to your test. If you need to reschedule for any reason, please contact radiology at [phone] to do so. __________________________________________________________________ A barium swallow is an examination that concentrates on views of the esophagus. This tends to be a double contrast exam (barium and two liquids which, when combined, create a gas to distend the wall of the oesophagus) or single contrast (non-ionic iodine based). The study is usually tailored to your symptoms so a good history is essential. Attention is paid during the study to the form, structure and configuration of the esophagus, looking for functional disorders (such as aspiration, dysphagia, achalasia, motility and reflux) EXAMINATION You may be asked to change into a gown, depending on the type of swallow being performed. A radiologist and radiographer will perform the procedure. The radiologist will advise you of the type of contrast selected for your procedure and direct you during the exam. You will be asked to stand, sit or lie in several different positions and to hold a small amount of fluid in your mouth before being asked to swallow while the imaging is performed .In some instances you may be asked to swallow barium coated marshmallows to assess the motility of a solid food bolus. The exam can be recorded as a digital or video fluoroscopy procedure. POST PROCEDURE It will take 1-2 days for the barium to pass through your system. To facilitate this, it is important, unless  otherwise directed, to increase your fluids for the next 24-48hrs and to resume your normal diet.  This test typically takes about 30 minutes to perform. __________________________________________________________________________________  I appreciate the opportunity to care for you. Amy Esterwood, PA-C

## 2023-03-19 NOTE — Progress Notes (Signed)
Noted  

## 2023-03-25 ENCOUNTER — Telehealth: Payer: Self-pay

## 2023-03-25 ENCOUNTER — Ambulatory Visit (HOSPITAL_COMMUNITY)
Admission: RE | Admit: 2023-03-25 | Discharge: 2023-03-25 | Disposition: A | Discharge status: Home / Self Care | Payer: BC Managed Care – PPO | Source: Ambulatory Visit | Attending: Physician Assistant | Admitting: Physician Assistant

## 2023-03-25 ENCOUNTER — Other Ambulatory Visit: Payer: Self-pay | Admitting: Physician Assistant

## 2023-03-25 DIAGNOSIS — K225 Diverticulum of esophagus, acquired: Secondary | ICD-10-CM | POA: Diagnosis not present

## 2023-03-25 DIAGNOSIS — K5790 Diverticulosis of intestine, part unspecified, without perforation or abscess without bleeding: Secondary | ICD-10-CM | POA: Diagnosis not present

## 2023-03-25 DIAGNOSIS — Z8719 Personal history of other diseases of the digestive system: Secondary | ICD-10-CM

## 2023-03-25 DIAGNOSIS — K219 Gastro-esophageal reflux disease without esophagitis: Secondary | ICD-10-CM | POA: Diagnosis not present

## 2023-03-25 DIAGNOSIS — R059 Cough, unspecified: Secondary | ICD-10-CM | POA: Diagnosis not present

## 2023-03-25 NOTE — Telephone Encounter (Signed)
Call report on the esophagram done today.  IMPRESSION: 1. Large Zenker's diverticulum increased significantly in volume from esophagram in 2018. 2. Questionable waist like narrowing in the proximal esophagus just below inferior margin of the diverticulum. 3. No reflux demonstrated during the course exam.   Full report in EMR. Thank you

## 2023-03-26 NOTE — Telephone Encounter (Signed)
Amy Esterwood, PA-C reviewed imaging report 03/25/23 at 3:23 pm and has sent a note to Dr [name] regarding his recommendations.

## 2023-04-01 ENCOUNTER — Telehealth: Payer: Self-pay

## 2023-04-01 NOTE — Telephone Encounter (Signed)
Esterwood, Amy S, PA-C  Hilarie Fredrickson, MD; Ethyn Schetter, Austin Miles, LPN Ok , thanks JP - sxs were coughing with lying down , and at night and sour brash type sxs  Beth - I tried to call pt - then sent him a my chart message  to give  him the Barium swallow result. Please call him and get him scheduled for EGD with Dr [name]  to further evaluate , and let me know when he is schedule for- thank you

## 2023-04-01 NOTE — Telephone Encounter (Signed)
Called the patient. No answer. Left message of my call.

## 2023-04-02 ENCOUNTER — Other Ambulatory Visit: Payer: Self-pay

## 2023-04-02 DIAGNOSIS — R059 Cough, unspecified: Secondary | ICD-10-CM

## 2023-04-02 DIAGNOSIS — K219 Gastro-esophageal reflux disease without esophagitis: Secondary | ICD-10-CM

## 2023-04-02 NOTE — Telephone Encounter (Signed)
Patient was scheduled for 12/1 at 10:00 AM. Patient would like instructions sent through MyChart.   Thank you.

## 2023-04-02 NOTE — Telephone Encounter (Signed)
Instructions sent to the patient through Communications" under the "letters" tab.

## 2023-04-13 ENCOUNTER — Other Ambulatory Visit: Payer: Self-pay | Admitting: Family Medicine

## 2023-04-13 DIAGNOSIS — D721 Eosinophilia, unspecified: Secondary | ICD-10-CM

## 2023-04-13 DIAGNOSIS — E785 Hyperlipidemia, unspecified: Secondary | ICD-10-CM

## 2023-04-13 DIAGNOSIS — Z125 Encounter for screening for malignant neoplasm of prostate: Secondary | ICD-10-CM

## 2023-04-15 ENCOUNTER — Other Ambulatory Visit: Payer: BC Managed Care – PPO

## 2023-04-15 ENCOUNTER — Ambulatory Visit (AMBULATORY_SURGERY_CENTER): Payer: BC Managed Care – PPO | Admitting: Internal Medicine

## 2023-04-15 ENCOUNTER — Encounter: Payer: Self-pay | Admitting: Internal Medicine

## 2023-04-15 VITALS — BP 127/88 | HR 72 | Temp 97.9°F | Resp 12 | Ht 68.0 in | Wt 149.0 lb

## 2023-04-15 DIAGNOSIS — Q387 Congenital pharyngeal pouch: Secondary | ICD-10-CM | POA: Diagnosis not present

## 2023-04-15 DIAGNOSIS — K219 Gastro-esophageal reflux disease without esophagitis: Secondary | ICD-10-CM

## 2023-04-15 DIAGNOSIS — R059 Cough, unspecified: Secondary | ICD-10-CM

## 2023-04-15 DIAGNOSIS — T17228A Food in pharynx causing other injury, initial encounter: Secondary | ICD-10-CM

## 2023-04-15 DIAGNOSIS — K225 Diverticulum of esophagus, acquired: Secondary | ICD-10-CM

## 2023-04-15 MED ORDER — SODIUM CHLORIDE 0.9 % IV SOLN: 500.0000 mL | Freq: Once | INTRAVENOUS | Status: DC

## 2023-04-15 NOTE — Progress Notes (Signed)
1106 - Pt coughing. Emesis noted. Airway supported. O2 continued. Good respiratory effort with palpable exhalation at mouth noted. Airway suctioned. HOB remains elevated

## 2023-04-15 NOTE — Progress Notes (Signed)
Pt awake, alert and oriented. VSS. Airway intact. SBAR complete to RN. All questions answered.  

## 2023-04-15 NOTE — Op Note (Signed)
[facility] Patient Name: Samuel Chapman Procedure Date: 04/15/2023 10:39 AM MRN: [id number] Endoscopist: Wilhemina Bonito. [name] , MD, [id number] Age: 62 Referring MD:  Date of Birth: Aug 18, 1960 Gender: Male Account #: [id number] Procedure:                Upper GI endoscopy Indications:              Dysphagia, Esophageal reflux, Chronic cough.                            History of endoscopic myotomy for Zenker's                            diverticulum 2017 Medicines:                Monitored Anesthesia Care Procedure:                Pre-Anesthesia Assessment:                           - Prior to the procedure, a History and Physical                            was performed, and patient medications and                            allergies were reviewed. The patient's tolerance of                            previous anesthesia was also reviewed. The risks                            and benefits of the procedure and the sedation                            options and risks were discussed with the patient.                            All questions were answered, and informed consent                            was obtained. Prior Anticoagulants: The patient has                            taken no anticoagulant or antiplatelet agents. ASA                            Grade Assessment: II - A patient with mild systemic                            disease. After reviewing the risks and benefits,                            the patient was deemed in satisfactory condition to  undergo the procedure.                           After obtaining informed consent, the endoscope was                            passed under direct vision. Throughout the                            procedure, the patient's blood pressure, pulse, and                            oxygen saturations were monitored continuously. The                            GIF [id number] #[id number] was introduced through the                             mouth, with the intention of advancing to the                            esophagus. The scope was advanced to the                            hypopharynx before the procedure was aborted.                            Medications were given. The procedure was aborted                            due to presence of food. Scope In: Scope Out: Findings:                 The posterior pharynx revealed a large diverticulum                            which was filled with foodstuff. The patient began                            coughing. It was elected to forego further                            evaluation due to the presence of food in the                            diverticulum and the risk for aspiration.. Complications:            No immediate complications. Estimated Blood Loss:     Estimated blood loss: none. Impression:               - The procedure was aborted due to presence of food                            and a large Zenker's diverticulum. Recommendation:           -  Patient has a contact number available for                            emergencies. The signs and symptoms of potential                            delayed complications were discussed with the                            patient. Return to normal activities tomorrow.                            Written discharge instructions were provided to the                            patient.                           - Resume previous diet.                           - Continue present medications.                           - Patient will likely need diverticulectomy with                            ENT. I will follow-up with his endoscopist at [facility],                            Dr. Wyline Mood regarding this issue. I will discuss                            with the patient thereafter. Wilhemina Bonito. [name], MD 04/15/2023 11:29:08 AM This report has been signed electronically.

## 2023-04-15 NOTE — Progress Notes (Signed)
Expand All Collapse All HISTORY OF PRESENT ILLNESS:   Samuel Chapman is a 62 y.o. male with a history of Zenker's diverticulum status post Zenker's diverticulectomy April 2017 at [facility].  He also has a history of GERD.  Last seen in this office 2/30 first 2023 regarding worsening GERD symptoms.  See that dictation.  He was prescribed pantoprazole 40 mg daily and follows up at this time.   He is pleased to report that the medication has been very effective.  No significant reflux symptoms.  Rare breakthrough with dietary indiscretion for which an acid helps.  No dysphagia.  Tolerating medication well.  Does have questions regarding long-term medication use.  No new complaints.   Blood work from April 09, 2022 shows normal comprehensive metabolic panel and normal TSH.   REVIEW OF SYSTEMS:   All non-GI ROS negative unless otherwise stated in the HPI        Past Medical History:  Diagnosis Date   Closed die punch fracture distal radius, right, initial encounter 04/25/2020   GERD (gastroesophageal reflux disease)     History of chicken pox     Influenza A 05/22/2018   Internal hemorrhoid     Low BP      post op    RAD (reactive airway disease)      ?of this vs mild asthma   Seasonal allergic rhinitis      tree pollen   Zenker's hypopharyngeal diverticulum 04/2015               Past Surgical History:  Procedure Laterality Date   COLONOSCOPY   01/20/2009    int hem, rpt 10 yrs [name])   COLONOSCOPY   04/2019    WNL rpt 10 yrs [name])   ESOPHAGOGASTRODUODENOSCOPY   2016    mod zenker's diverticulum, referred to tertiary center [name])   HERNIA REPAIR   [phone], 1966   ZENKER'S DIVERTICULECTOMY ENDOSCOPIC   08/2015    Dr Wyline Mood at [facility]          Social History Heinz Fago  reports that he has never smoked. He has never used smokeless tobacco. He reports current alcohol use of about 21.0 standard drinks of alcohol per week. He reports that he does not use drugs.   family history  includes Aneurysm in his father; CAD in his maternal grandfather; Diabetes in his brother; Lung cancer (age of onset: 28) in his mother; Stroke (age of onset: 47) in his father; Ulcers (age of onset: 58) in his father.   Allergies      Allergies  Allergen Reactions   Bee Venom Swelling            PHYSICAL EXAMINATION: Vital signs: BP 114/80   Pulse 70   Ht 5' 6.5" (1.689 m)   Wt 152 lb (68.9 kg)   BMI 24.17 kg/m   Constitutional: generally well-appearing, no acute distress Psychiatric: alert and oriented x3, cooperative Eyes: Anicteric Abdomen: Not reexamined Extremities: no abnormalities of visible Skin: no lesions on visible extremities Neuro: Grossly intact   ASSESSMENT:   1.  GERD.  Being effectively controlled with pantoprazole 40 mg daily 2.  History of Zenker's diverticulum status post successful endoscopic diverticulectomy 2017 3.  Normal screening colonoscopy 2010 and 2020     PLAN:   1.  Reflux precautions 2.  Continue pantoprazole 40 mg daily 3.  Prescribe pantoprazole 40 mg daily.  Medication risks reviewed 4.  Routine office follow-up 1 year.  Contact the office in  the interim for any questions or problems 5.  Routine repeat screening colonoscopy 2030

## 2023-04-15 NOTE — Patient Instructions (Signed)
Discharge instructions given. The procedure was aborted due to presence of food. See Recommendation on report. Resume previous diet. Continue medications. YOU HAD AN ENDOSCOPIC PROCEDURE TODAY AT THE [facility]:   Refer to the procedure report that was given to you for any specific questions about what was found during the examination.  If the procedure report does not answer your questions, please call your gastroenterologist to clarify.  If you requested that your care partner not be given the details of your procedure findings, then the procedure report has been included in a sealed envelope for you to review at your convenience later.  YOU SHOULD EXPECT: Some feelings of bloating in the abdomen. Passage of more gas than usual.  Walking can help get rid of the air that was put into your GI tract during the procedure and reduce the bloating. If you had a lower endoscopy (such as a colonoscopy or flexible sigmoidoscopy) you may notice spotting of blood in your stool or on the toilet paper. If you underwent a bowel prep for your procedure, you may not have a normal bowel movement for a few days.  Please Note:  You might notice some irritation and congestion in your nose or some drainage.  This is from the oxygen used during your procedure.  There is no need for concern and it should clear up in a day or so.  SYMPTOMS TO REPORT IMMEDIATELY:  Following upper endoscopy (EGD)  Vomiting of blood or coffee ground material  New chest pain or pain under the shoulder blades  Painful or persistently difficult swallowing  New shortness of breath  Fever of 100F or higher  Black, tarry-looking stools  For urgent or emergent issues, a gastroenterologist can be reached at any hour by calling [phone]. Do not use MyChart messaging for urgent concerns.    DIET:  We do recommend a small meal at first, but then you may proceed to your regular diet.  Drink plenty of fluids but you should  avoid alcoholic beverages for 24 hours.  ACTIVITY:  You should plan to take it easy for the rest of today and you should NOT DRIVE or use heavy machinery until tomorrow (because of the sedation medicines used during the test).    FOLLOW UP: Our staff will call the number listed on your records the next business day following your procedure.  We will call around 7:15- 8:00 am to check on you and address any questions or concerns that you may have regarding the information given to you following your procedure. If we do not reach you, we will leave a message.     If any biopsies were taken you will be contacted by phone or by letter within the next 1-3 weeks.  Please call us at [phone] if you have not heard about the biopsies in 3 weeks.    SIGNATURES/CONFIDENTIALITY: You and/or your care partner have signed paperwork which will be entered into your electronic medical record.  These signatures attest to the fact that that the information above on your After Visit Summary has been reviewed and is understood.  Full responsibility of the confidentiality of this discharge information lies with you and/or your care-partner.

## 2023-04-15 NOTE — Progress Notes (Signed)
Pt's states no medical or surgical changes since previsit or office visit. 

## 2023-04-16 ENCOUNTER — Telehealth: Payer: Self-pay

## 2023-04-16 NOTE — Telephone Encounter (Signed)
  Follow up Call-     04/15/2023    9:54 AM  Call back number  Post procedure Call Back phone  # [phone]  Permission to leave phone message Yes     Patient questions:  Do you have a fever, pain , or abdominal swelling? No. Pain Score  0 *  Have you tolerated food without any problems? Yes.    Have you been able to return to your normal activities? Yes.    Do you have any questions about your discharge instructions: Diet   No. Medications  No. Follow up visit  No.  Do you have questions or concerns about your Care? No.  Actions: * If pain score is 4 or above: No action needed, pain <4.

## 2023-04-17 ENCOUNTER — Other Ambulatory Visit: Payer: BC Managed Care – PPO

## 2023-04-17 DIAGNOSIS — Z125 Encounter for screening for malignant neoplasm of prostate: Secondary | ICD-10-CM

## 2023-04-17 DIAGNOSIS — E785 Hyperlipidemia, unspecified: Secondary | ICD-10-CM

## 2023-04-17 DIAGNOSIS — D721 Eosinophilia, unspecified: Secondary | ICD-10-CM

## 2023-04-17 LAB — COMPREHENSIVE METABOLIC PANEL
ALT: 25 U/L (ref 0–53)
AST: 23 U/L (ref 0–37)
Albumin: 4.3 g/dL (ref 3.5–5.2)
Alkaline Phosphatase: 62 U/L (ref 39–117)
BUN: 14 mg/dL (ref 6–23)
CO2: 28 meq/L (ref 19–32)
Calcium: 9.5 mg/dL (ref 8.4–10.5)
Chloride: 104 meq/L (ref 96–112)
Creatinine, Ser: 0.84 mg/dL (ref 0.40–1.50)
GFR: 92.29 mL/min (ref >60.00)
Glucose, Bld: 110 mg/dL — ABNORMAL HIGH (ref 70–99)
Potassium: 4.6 meq/L (ref 3.5–5.1)
Sodium: 138 meq/L (ref 135–145)
Total Bilirubin: 0.5 mg/dL (ref 0.2–1.2)
Total Protein: 6.7 g/dL (ref 6.0–8.3)

## 2023-04-17 LAB — CBC WITH DIFFERENTIAL/PLATELET
Basophils Absolute: 0.1 10*3/uL (ref 0.0–0.1)
Basophils Relative: 0.6 % (ref 0.0–3.0)
Eosinophils Absolute: 0.2 10*3/uL (ref 0.0–0.7)
Eosinophils Relative: 2.2 % (ref 0.0–5.0)
HCT: 43.6 % (ref 39.0–52.0)
Hemoglobin: 14.4 g/dL (ref 13.0–17.0)
Lymphocytes Relative: 21.4 % (ref 12.0–46.0)
Lymphs Abs: 2.2 10*3/uL (ref 0.7–4.0)
MCHC: 33.1 g/dL (ref 30.0–36.0)
MCV: 94.7 fL (ref 78.0–100.0)
Monocytes Absolute: 0.5 10*3/uL (ref 0.1–1.0)
Monocytes Relative: 5 % (ref 3.0–12.0)
Neutro Abs: 7.1 10*3/uL (ref 1.4–7.7)
Neutrophils Relative %: 70.8 % (ref 43.0–77.0)
Platelets: 333 10*3/uL (ref 150.0–400.0)
RBC: 4.6 Mil/uL (ref 4.22–5.81)
RDW: 13.4 % (ref 11.5–15.5)
WBC: 10.1 10*3/uL (ref 4.0–10.5)

## 2023-04-17 LAB — LIPID PANEL
Cholesterol: 215 mg/dL — ABNORMAL HIGH (ref 0–200)
HDL: 70.5 mg/dL (ref >39.00)
LDL Cholesterol: 121 mg/dL — ABNORMAL HIGH (ref 0–99)
NonHDL: 144.46
Total CHOL/HDL Ratio: 3
Triglycerides: 118 mg/dL (ref 0.0–149.0)
VLDL: 23.6 mg/dL (ref 0.0–40.0)

## 2023-04-17 LAB — PSA: PSA: 0.78 ng/mL (ref 0.10–4.00)

## 2023-04-17 LAB — TSH: TSH: 2.31 u[IU]/mL (ref 0.35–5.50)

## 2023-04-22 ENCOUNTER — Encounter: Payer: Self-pay | Admitting: Family Medicine

## 2023-04-22 ENCOUNTER — Ambulatory Visit (INDEPENDENT_AMBULATORY_CARE_PROVIDER_SITE_OTHER): Payer: BC Managed Care – PPO | Admitting: Family Medicine

## 2023-04-22 VITALS — BP 126/76 | HR 74 | Temp 98.1°F | Ht 66.5 in | Wt 147.2 lb

## 2023-04-22 DIAGNOSIS — Z0000 Encounter for general adult medical examination without abnormal findings: Secondary | ICD-10-CM | POA: Diagnosis not present

## 2023-04-22 DIAGNOSIS — Z8719 Personal history of other diseases of the digestive system: Secondary | ICD-10-CM

## 2023-04-22 DIAGNOSIS — Z9889 Other specified postprocedural states: Secondary | ICD-10-CM

## 2023-04-22 DIAGNOSIS — E785 Hyperlipidemia, unspecified: Secondary | ICD-10-CM

## 2023-04-22 DIAGNOSIS — K219 Gastro-esophageal reflux disease without esophagitis: Secondary | ICD-10-CM | POA: Diagnosis not present

## 2023-04-22 DIAGNOSIS — K225 Diverticulum of esophagus, acquired: Secondary | ICD-10-CM | POA: Diagnosis not present

## 2023-04-22 NOTE — Assessment & Plan Note (Signed)
Chronic, mild off medications. Will continue to monitor, reviewed diet choices to improve LDL control.  The 10-year ASCVD risk score (Arnett DK, et al., 2019) is: 9.9%   Values used to calculate the score:     Age: 62 years     Sex: Male     Is Non-Hispanic African American: No     Diabetic: No     Tobacco smoker: No     Systolic Blood Pressure: 126 mmHg     Is BP treated: No     HDL Cholesterol: 70.5 mg/dL     Total Cholesterol: 215 mg/dL

## 2023-04-22 NOTE — Assessment & Plan Note (Signed)
Preventative protocols reviewed and updated unless pt declined. Discussed healthy diet and lifestyle.  

## 2023-04-22 NOTE — Assessment & Plan Note (Signed)
Recurrent by latest EGD - pending surgical evaluation.

## 2023-04-22 NOTE — Patient Instructions (Addendum)
Let me know what you find out about recurrent Zenker's Good to see you today Return as needed or in 1 year for next physical (welcome to medicare visit)

## 2023-04-22 NOTE — Assessment & Plan Note (Addendum)
Chronic, stable period on pantoprazole 40mg  daily with pepcid 20mg  nightly with benefit.

## 2023-04-22 NOTE — Progress Notes (Signed)
Ph: [phone] Fax: [phone]   Patient ID: Samuel Chapman, male    DOB: 06-30-1960, 62 y.o.   MRN: [id number]  This visit was conducted in person.  BP 126/76   Pulse 74   Temp 98.1 F (36.7 C) (Oral)   Ht 5' 6.5" (1.689 m)   Wt 147 lb 4 oz (66.8 kg)   SpO2 97%   BMI 23.41 kg/m    CC: CPE Subjective:   HPI: Samuel Chapman is a 62 y.o. male presenting on 04/22/2023 for Annual Exam   Zencker's diverticulum s/p diverticulectomy, intraluminal approach at [facility] 08/2015. Sees GI yearly ([facility]/Amy Esterwood). Continues pantoprazole 40mg  daily, recently added pepcid 20mg  nightly. Recent barium swallow showed recurrence of Zenker's diverticulum. Subsequent EGD attempted last week, stopped early due to large posterior Zenker's diverticulum with food present. Planned return to Dr Wyline Mood at [facility] for re-evaluation. Upcoming trip to [place] next week.   Recent labs were not fasting.   Preventative: COLONOSCOPY 04/2019 - WNL rpt 10 yrs [name])  Prostate cancer screening - nocturia x1. Yearly screen.  Lung cancer screening - not eligible  Flu yearly  COVID vaccines Moderna 07/2199 x2, booster 01/2020, bivalent booster 02/2021, 02/2022 Tdap 2016 RSV - 02/2022  Shingrix - 04/2020, 06/2020 Seat belt use discussed  Sunscreen use discussed, no changing moles on skin.  Sleep - averaging 8 hours/night Non-smoker  Alcohol - 2-3 glasses of wine/day.  Dentist regularly - had implant this year  Eye exam - yearly watching early cataracts   Lives with husband Photographer) Edu: university Occupation: works at [employer] Activity: gym with trainer twice weekly, pilates, walks dog daily, tennis twice weekly  Diet: good water, fruits/vegetables daily      Relevant past medical, surgical, family and social history reviewed and updated as indicated. Interim medical history since our last visit reviewed. Allergies and medications reviewed and updated. Outpatient Medications Prior to Visit   Medication Sig Dispense Refill   Cholecalciferol (VITAMIN D3) 25 MCG (1000 UT) CAPS Take 1 capsule (1,000 Units total) by mouth daily. 30 capsule    famotidine (PEPCID) 20 MG tablet Take 1 tablet (20 mg total) by mouth at bedtime. 90 tablet 4   loratadine (CLARITIN) 10 MG tablet Take 10 mg by mouth daily as needed. As needed for seasonal allergies     Multiple Vitamin (MULTIVITAMIN) tablet Take 1 tablet by mouth daily.     pantoprazole (PROTONIX) 40 MG tablet Take 1 tablet (40 mg total) by mouth daily before breakfast. 90 tablet 4   No facility-administered medications prior to visit.     Per HPI unless specifically indicated in ROS section below Review of Systems  Constitutional:  Negative for activity change, appetite change, chills, fatigue, fever and unexpected weight change.  HENT:  Negative for hearing loss.   Eyes:  Negative for visual disturbance.  Respiratory:  Positive for cough (zenker's related). Negative for chest tightness, shortness of breath and wheezing.   Cardiovascular:  Negative for chest pain, palpitations and leg swelling.  Gastrointestinal:  Negative for abdominal distention, abdominal pain, blood in stool, constipation, diarrhea, nausea and vomiting.  Genitourinary:  Negative for difficulty urinating and hematuria.  Musculoskeletal:  Negative for arthralgias, myalgias and neck pain.  Skin:  Negative for rash.  Neurological:  Negative for dizziness, seizures, syncope and headaches.  Hematological:  Negative for adenopathy. Does not bruise/bleed easily.  Psychiatric/Behavioral:  Negative for dysphoric mood. The patient is not nervous/anxious.     Objective:  BP 126/76  Pulse 74   Temp 98.1 F (36.7 C) (Oral)   Ht 5' 6.5" (1.689 m)   Wt 147 lb 4 oz (66.8 kg)   SpO2 97%   BMI 23.41 kg/m   Wt Readings from Last 3 Encounters:  04/22/23 147 lb 4 oz (66.8 kg)  04/15/23 149 lb (67.6 kg)  03/18/23 149 lb 8 oz (67.8 kg)      Physical Exam Vitals and nursing  note reviewed.  Constitutional:      General: He is not in acute distress.    Appearance: Normal appearance. He is well-developed. He is not ill-appearing.  HENT:     Head: Normocephalic and atraumatic.     Right Ear: Hearing, tympanic membrane, ear canal and external ear normal.     Left Ear: Hearing, tympanic membrane, ear canal and external ear normal.  Eyes:     General: No scleral icterus.    Extraocular Movements: Extraocular movements intact.     Conjunctiva/sclera: Conjunctivae normal.     Pupils: Pupils are equal, round, and reactive to light.  Neck:     Thyroid: No thyroid mass or thyromegaly.  Cardiovascular:     Rate and Rhythm: Normal rate and regular rhythm.     Pulses: Normal pulses.          Radial pulses are 2+ on the right side and 2+ on the left side.     Heart sounds: Normal heart sounds. No murmur heard. Pulmonary:     Effort: Pulmonary effort is normal. No respiratory distress.     Breath sounds: Normal breath sounds. No wheezing, rhonchi or rales.  Abdominal:     General: Bowel sounds are normal. There is no distension.     Palpations: Abdomen is soft. There is no mass.     Tenderness: There is no abdominal tenderness. There is no guarding or rebound.     Hernia: No hernia is present.  Musculoskeletal:        General: Normal range of motion.     Cervical back: Normal range of motion and neck supple.     Right lower leg: No edema.     Left lower leg: No edema.  Lymphadenopathy:     Cervical: No cervical adenopathy.  Skin:    General: Skin is warm and dry.     Findings: No rash.  Neurological:     General: No focal deficit present.     Mental Status: He is alert and oriented to person, place, and time.  Psychiatric:        Mood and Affect: Mood normal.        Behavior: Behavior normal.        Thought Content: Thought content normal.        Judgment: Judgment normal.       Results for orders placed or performed in visit on 04/17/23  CBC with  Differential/Platelet  Result Value Ref Range   WBC 10.1 4.0 - 10.5 K/uL   RBC 4.60 4.22 - 5.81 Mil/uL   Hemoglobin 14.4 13.0 - 17.0 g/dL   HCT 16.1 09.6 - 04.5 %   MCV 94.7 78.0 - 100.0 fl   MCHC 33.1 30.0 - 36.0 g/dL   RDW 40.9 81.1 - 91.4 %   Platelets 333.0 150.0 - 400.0 K/uL   Neutrophils Relative % 70.8 43.0 - 77.0 %   Lymphocytes Relative 21.4 12.0 - 46.0 %   Monocytes Relative 5.0 3.0 - 12.0 %   Eosinophils Relative 2.2 0.0 -  5.0 %   Basophils Relative 0.6 0.0 - 3.0 %   Neutro Abs 7.1 1.4 - 7.7 K/uL   Lymphs Abs 2.2 0.7 - 4.0 K/uL   Monocytes Absolute 0.5 0.1 - 1.0 K/uL   Eosinophils Absolute 0.2 0.0 - 0.7 K/uL   Basophils Absolute 0.1 0.0 - 0.1 K/uL  PSA  Result Value Ref Range   PSA 0.78 0.10 - 4.00 ng/mL  TSH  Result Value Ref Range   TSH 2.31 0.35 - 5.50 uIU/mL  Comprehensive metabolic panel  Result Value Ref Range   Sodium 138 135 - 145 mEq/L   Potassium 4.6 3.5 - 5.1 mEq/L   Chloride 104 96 - 112 mEq/L   CO2 28 19 - 32 mEq/L   Glucose, Bld 110 (H) 70 - 99 mg/dL   BUN 14 6 - 23 mg/dL   Creatinine, Ser 6.43 0.40 - 1.50 mg/dL   Total Bilirubin 0.5 0.2 - 1.2 mg/dL   Alkaline Phosphatase 62 39 - 117 U/L   AST 23 0 - 37 U/L   ALT 25 0 - 53 U/L   Total Protein 6.7 6.0 - 8.3 g/dL   Albumin 4.3 3.5 - 5.2 g/dL   GFR 32.95 >18.84 mL/min   Calcium 9.5 8.4 - 10.5 mg/dL  Lipid panel  Result Value Ref Range   Cholesterol 215 (H) 0 - 200 mg/dL   Triglycerides 166.0 0.0 - 149.0 mg/dL   HDL 63.01 >60.10 mg/dL   VLDL 93.2 0.0 - 35.5 mg/dL   LDL Cholesterol 732 (H) 0 - 99 mg/dL   Total CHOL/HDL Ratio 3    NonHDL 144.46     Assessment & Plan:   Problem List Items Addressed This Visit     Health maintenance examination - Primary (Chronic)    Preventative protocols reviewed and updated unless pt declined. Discussed healthy diet and lifestyle.       GERD (gastroesophageal reflux disease)    Chronic, stable period on pantoprazole 40mg  daily with pepcid 20mg   nightly with benefit.       HLD (hyperlipidemia)    Chronic, mild off medications. Will continue to monitor, reviewed diet choices to improve LDL control.  The 10-year ASCVD risk score (Arnett DK, et al., 2019) is: 9.9%   Values used to calculate the score:     Age: 39 years     Sex: Male     Is Non-Hispanic African American: No     Diabetic: No     Tobacco smoker: No     Systolic Blood Pressure: 126 mmHg     Is BP treated: No     HDL Cholesterol: 70.5 mg/dL     Total Cholesterol: 215 mg/dL       History of excision of Zenker's diverticulum   Zenker's diverticulum    Recurrent by latest EGD - pending surgical evaluation.         No orders of the defined types were placed in this encounter.   No orders of the defined types were placed in this encounter.   Patient Instructions  Let me know what you find out about recurrent Zenker's Good to see you today Return as needed or in 1 year for next physical (welcome to medicare visit)   Follow up plan: Return in about 1 year (around 04/21/2024).  Eustaquio Boyden, MD

## 2023-06-12 ENCOUNTER — Telehealth: Payer: Self-pay

## 2023-06-12 NOTE — Telephone Encounter (Signed)
Spoke with pt and he is aware that he should be contacted by Dr. Wyline Mood regarding an appt at [facility].

## 2023-06-12 NOTE — Telephone Encounter (Signed)
-----   Message from Yancey Flemings sent at 06/12/2023  3:24 PM EST ----- Regarding: Follow-up with Dr. Wyline Mood at [facility], Please contact the patient and let him know that I spoke with Dr. Wyline Mood, at [facility], regarding his swallowing issues. Dr. Verna Czech office will contact the patient and make him an appointment to be seen at [facility]. Thanks, Dr. [name]  Please convert this to a phone note for the record.  Thanks

## 2023-07-02 DIAGNOSIS — K225 Diverticulum of esophagus, acquired: Secondary | ICD-10-CM | POA: Diagnosis not present

## 2023-07-15 ENCOUNTER — Other Ambulatory Visit: Payer: Self-pay | Admitting: Internal Medicine

## 2023-07-17 ENCOUNTER — Encounter: Payer: Self-pay | Admitting: Family Medicine

## 2023-07-17 ENCOUNTER — Encounter: Payer: Self-pay | Admitting: Internal Medicine

## 2023-08-08 DIAGNOSIS — K225 Diverticulum of esophagus, acquired: Secondary | ICD-10-CM | POA: Diagnosis not present

## 2023-08-19 ENCOUNTER — Encounter: Payer: Self-pay | Admitting: Internal Medicine

## 2023-10-02 DIAGNOSIS — K225 Diverticulum of esophagus, acquired: Secondary | ICD-10-CM | POA: Diagnosis not present

## 2024-02-11 NOTE — Progress Notes (Unsigned)
   LILLETTE Ileana Collet, PhD, LAT, ATC acting as a scribe for Artist Lloyd, MD.  Armstrong Creasy is a 63 y.o. male who presents to [facility] today for R***L thigh pain x ***. Pt locates pain to ***  Aggravates: Treatments tried:  Pertinent review of systems: ***  Relevant historical information: ***   Exam:  There were no vitals taken for this visit. General: Well Developed, well nourished, and in no acute distress.   MSK: ***    Lab and Radiology Results No results found for this or any previous visit (from the past 72 hours). No results found.     Assessment and Plan: 63 y.o. male with ***   PDMP not reviewed this encounter. No orders of the defined types were placed in this encounter.  No orders of the defined types were placed in this encounter.    Discussed warning signs or symptoms. Please see discharge instructions. Patient expresses understanding.   ***

## 2024-02-12 ENCOUNTER — Ambulatory Visit (INDEPENDENT_AMBULATORY_CARE_PROVIDER_SITE_OTHER): Admitting: Family Medicine

## 2024-02-12 ENCOUNTER — Encounter: Payer: Self-pay | Admitting: Family Medicine

## 2024-02-12 ENCOUNTER — Other Ambulatory Visit: Payer: Self-pay

## 2024-02-12 VITALS — HR 79 | Ht 66.5 in | Wt 156.0 lb

## 2024-02-12 DIAGNOSIS — M79651 Pain in right thigh: Secondary | ICD-10-CM | POA: Diagnosis not present

## 2024-02-12 NOTE — Patient Instructions (Addendum)
 Thank you for coming in today.   A referral for physical therapy has been submitted. A representative from the physical therapy office will contact you to coordinate scheduling after confirming your benefits with your insurance provider. If you do not hear from the physical therapy office within the next 1-2 weeks, please let us  know.   Recommend a purple seat cushion.   HEP2GO exercises provided - View at my-exercise-[email or web address] code [id number]  Askling-L protocol on youtube  See you back in 6 weeks.

## 2024-02-25 ENCOUNTER — Ambulatory Visit: Admitting: Physical Therapy

## 2024-02-25 ENCOUNTER — Encounter: Payer: Self-pay | Admitting: Physical Therapy

## 2024-02-25 ENCOUNTER — Other Ambulatory Visit: Payer: Self-pay

## 2024-02-25 DIAGNOSIS — M6281 Muscle weakness (generalized): Secondary | ICD-10-CM

## 2024-02-25 DIAGNOSIS — M79604 Pain in right leg: Secondary | ICD-10-CM

## 2024-02-25 NOTE — Therapy (Signed)
 OUTPATIENT PHYSICAL THERAPY EVALUATION   Patient Name: Samuel Chapman MRN: [id number] DOB:08-26-1960, 63 y.o., male Today's Date: 02/25/2024   END OF SESSION:  PT End of Session - 02/25/24 1453     Visit Number 1    Number of Visits 6    Date for Recertification  04/07/24    Authorization Type MCR    Progress Note Due on Visit 10    PT Start Time 1432    PT Stop Time 1515    PT Time Calculation (min) 43 min    Activity Tolerance Patient tolerated treatment well    Behavior During Therapy [facility] for tasks assessed/performed          Past Medical History:  Diagnosis Date   Closed die punch fracture distal radius, right, initial encounter 04/25/2020   GERD (gastroesophageal reflux disease)    History of chicken pox    Influenza A 05/22/2018   Internal hemorrhoid    Low BP    post op    RAD (reactive airway disease)    ?of this vs mild asthma   Seasonal allergic rhinitis    tree pollen   Zenker's hypopharyngeal diverticulum 04/2015   Past Surgical History:  Procedure Laterality Date   COLONOSCOPY  01/20/2009   int hem, rpt 10 yrs [facility])   COLONOSCOPY  04/2019   WNL rpt 10 yrs [facility])   ESOPHAGOGASTRODUODENOSCOPY  2016   mod zenker's diverticulum, referred to tertiary center [facility])   HERNIA REPAIR  [phone], 1966   ZENKER'S DIVERTICULECTOMY ENDOSCOPIC  08/2015   Dr Alvan at [facility]   Patient Active Problem List   Diagnosis Date Noted   Zenker's diverticulum 04/22/2023   Hearing loss, left 01/03/2018   History of excision of Zenker's diverticulum 12/29/2015   HLD (hyperlipidemia) 12/25/2015   Health maintenance examination 12/27/2014   GERD (gastroesophageal reflux disease) 08/16/2014   Internal hemorrhoid 08/16/2014   Seasonal allergic rhinitis     PCP: Rilla Baller, MD  REFERRING PROVIDER: Joane Artist RAMAN, MD  REFERRING DIAG: Right thigh pain  Rationale for Evaluation and Treatment: Rehabilitation  THERAPY DIAG:  Pain in right leg  Muscle weakness  (generalized)  ONSET DATE: Chronic   SUBJECTIVE:        SUBJECTIVE STATEMENT: Patient reports about 7-8 weeks he was carrying his dog up the stairs and slipped and jammed his right hip. He was told he had a torn hamstring. He reports getting better by the week and he does his stretches consistently. He reports he does get some pain or stiffness in the morning. He would like to get back to playing tennis, last played about 4 weeks ago. He also does pilates and does a trainer. He reports no pain with is his stretches or exercises.   PERTINENT HISTORY:  See PMH above  PAIN:  Are you having pain? Yes:  NPRS scale: 0/10 currently Pain location: Right proximal hamstring region Pain description: Tight Aggravating factors: Quick movements, tennis Relieving factors: Stretching, exercise  PRECAUTIONS: None  RED FLAGS: None   WEIGHT BEARING RESTRICTIONS: No  FALLS:  Has patient fallen in last 6 months? No  PLOF: Independent  PATIENT GOALS: Get back to playing tennis   OBJECTIVE:  Note: Objective measures were completed at Evaluation unless otherwise noted. PATIENT SURVEYS:  LEFS: 61/80  COGNITION: Overall cognitive status: Within functional limits for tasks assessed     SENSATION: WFL  MUSCLE LENGTH: Slight limitation in right hamstring length  POSTURE:   Grossly WFL  PALPATION:  No-tender to palpation  LUMBAR ROM:   AROM eval  Flexion   Extension   Right lateral flexion   Left lateral flexion   Right rotation   Left rotation    (Blank rows = not tested)  LOWER EXTREMITY ROM:      Hip and knee PROM grossly WFL  LOWER EXTREMITY MMT:    MMT Right eval Left eval  Hip flexion    Hip extension 4 4-  Hip abduction 4 4-  Hip adduction    Hip internal rotation    Hip external rotation    Knee flexion 4+ 4  Knee extension    Ankle dorsiflexion    Ankle plantarflexion    Ankle inversion    Ankle eversion     (Blank rows = not tested)  FUNCTIONAL  TESTS:  Squat: grossly WFL  GAIT: Assistive device utilized: None Level of assistance: Complete Independence Comments: WFL   TREATMENT  [facility] Adult PT Treatment:                                                DATE: 02/25/2024 Bridge 10 x 5 sec SL bridge 10 x 5 sec each Side clamshell with green x 15 right Squat 2 x 10 Reverse lunge x 5 each SL RDL x 10 each  Discussed healing for muscle strain and gradual return to activities such as tennis. Discussed when to perform exercises in combination with current pilates and workouts with trainer  PATIENT EDUCATION:  Education details: Exam findings, POC, HEP Person educated: Patient Education method: Explanation, Demonstration, Tactile cues, Verbal cues, and Handouts Education comprehension: verbalized understanding, returned demonstration, verbal cues required, tactile cues required, and needs further education  HOME EXERCISE PROGRAM: Access Code: [email or web address]    ASSESSMENT: CLINICAL IMPRESSION: Patient is a 63 y.o. male who was seen today for physical therapy evaluation and treatment for right posterior thigh pain consistent with chronic hamstring strain that seems to be doing much better currently. He does exhibit slight flexibility limitation of the right hamstring and strength deficit of the right hamstring and glutes.   OBJECTIVE IMPAIRMENTS: decreased activity tolerance, decreased strength, impaired flexibility, and pain.   ACTIVITY LIMITATIONS: lifting, squatting, and locomotion level  PARTICIPATION LIMITATIONS: community activity  PERSONAL FACTORS: Time since onset of injury/illness/exacerbation are also affecting patient's functional outcome.   REHAB POTENTIAL: Good  CLINICAL DECISION MAKING: Stable/uncomplicated  EVALUATION COMPLEXITY: Low   GOALS: Goals reviewed with patient? Yes  SHORT TERM GOALS: Target date: = LTG  LONG TERM GOALS: Target date: 04/07/2024  Patient will be I with final HEP to maintain progress  from PT. Baseline: HEP provided at eval Goal status: INITIAL  2.  Patient will report LEFS >/= 75/80 in order to indicate improvement in his functional status Baseline: 61/80 Goal status: INITIAL  3.  Patient will demonstrate right hamstring strength 5/5 MMT and hip strength >/= 4/5 MMT in order to improve tolerance for tennis related activities Baseline:  Goal status: INITIAL  4.  Patient will report ability to play tennis without increased pain or limitation to return to prior activity level Baseline: currently not playing tennis Goal status: INITIAL   PLAN: PT FREQUENCY: 1x/week  PT DURATION: 6 weeks  PLANNED INTERVENTIONS: 97164- PT Re-evaluation, 97750- Physical Performance Testing, 97110-Therapeutic exercises, 97530- Therapeutic activity, [id number]- Neuromuscular re-education, 97535- Self Care, 02859- Manual therapy, [phone] (  1-2 muscles), 20561 (3+ muscles)- Dry Needling, Patient/Family education, Balance training, Cryotherapy, and Moist heat.  PLAN FOR NEXT SESSION: Review HEP and progress PRN, manual for right hamstring as indicated, progress right hamstring/hip/core strengthening, progress more dynamic balance and tennis related activities, plyometrics when indicated   Elaine Daring, PT, DPT, LAT, ATC 02/25/24  4:27 PM Phone: [phone] Fax: [phone]

## 2024-02-25 NOTE — Patient Instructions (Signed)
 Access Code: [email or web address] URL: [email or web address] Date: 02/25/2024 Prepared by: Elaine Daring  Exercises - Single Leg Bridge  - 3 x weekly - 3 sets - 10 reps - 5 seconds hold - Clam with Resistance  - 3 x weekly - 3 sets - 15 reps - Squat  - 3 x weekly - 3 sets - 10 reps - Forward T  - 3 x weekly - 3 sets - 10 reps

## 2024-03-02 ENCOUNTER — Other Ambulatory Visit: Payer: Self-pay

## 2024-03-02 ENCOUNTER — Encounter: Payer: Self-pay | Admitting: Physical Therapy

## 2024-03-02 ENCOUNTER — Ambulatory Visit (INDEPENDENT_AMBULATORY_CARE_PROVIDER_SITE_OTHER): Admitting: Physical Therapy

## 2024-03-02 DIAGNOSIS — M79604 Pain in right leg: Secondary | ICD-10-CM

## 2024-03-02 DIAGNOSIS — M6281 Muscle weakness (generalized): Secondary | ICD-10-CM | POA: Diagnosis not present

## 2024-03-02 NOTE — Therapy (Unsigned)
 OUTPATIENT PHYSICAL THERAPY TREATMENT   Patient Name: Samuel Chapman MRN: [id number] DOB:06-05-1960, 63 y.o., male Today's Date: 03/03/2024   END OF SESSION:  PT End of Session - 03/02/24 1521     Visit Number 2    Number of Visits 6    Date for Recertification  04/07/24    Authorization Type MCR    Progress Note Due on Visit 10    PT Start Time 1518    PT Stop Time 1600    PT Time Calculation (min) 42 min    Activity Tolerance Patient tolerated treatment well    Behavior During Therapy [facility] for tasks assessed/performed           Past Medical History:  Diagnosis Date   Closed die punch fracture distal radius, right, initial encounter 04/25/2020   GERD (gastroesophageal reflux disease)    History of chicken pox    Influenza A 05/22/2018   Internal hemorrhoid    Low BP    post op    RAD (reactive airway disease)    ?of this vs mild asthma   Seasonal allergic rhinitis    tree pollen   Zenker's hypopharyngeal diverticulum 04/2015   Past Surgical History:  Procedure Laterality Date   COLONOSCOPY  01/20/2009   int hem, rpt 10 yrs [facility])   COLONOSCOPY  04/2019   WNL rpt 10 yrs [facility])   ESOPHAGOGASTRODUODENOSCOPY  2016   mod zenker's diverticulum, referred to tertiary center [facility])   HERNIA REPAIR  [phone], 1966   ZENKER'S DIVERTICULECTOMY ENDOSCOPIC  08/2015   Dr Alvan at [facility]   Patient Active Problem List   Diagnosis Date Noted   Zenker's diverticulum 04/22/2023   Hearing loss, left 01/03/2018   History of excision of Zenker's diverticulum 12/29/2015   HLD (hyperlipidemia) 12/25/2015   Health maintenance examination 12/27/2014   GERD (gastroesophageal reflux disease) 08/16/2014   Internal hemorrhoid 08/16/2014   Seasonal allergic rhinitis     PCP: Rilla Baller, MD  REFERRING PROVIDER: Joane Artist RAMAN, MD  REFERRING DIAG: Right thigh pain  Rationale for Evaluation and Treatment: Rehabilitation  THERAPY DIAG:  Pain in right leg  Muscle weakness  (generalized)  ONSET DATE: Chronic   SUBJECTIVE:        SUBJECTIVE STATEMENT: Patient reports he has been doing well and is consistent with his exercises at home. He plans to play tennis twice later this week, just hitting with tennis pro.  Eval: Patient reports about 7-8 weeks he was carrying his dog up the stairs and slipped and jammed his right hip. He was told he had a torn hamstring. He reports getting better by the week and he does his stretches consistently. He reports he does get some pain or stiffness in the morning. He would like to get back to playing tennis, last played about 4 weeks ago. He also does pilates and does a trainer. He reports no pain with is his stretches or exercises.   PERTINENT HISTORY:  See PMH above  PAIN:  Are you having pain? Yes:  NPRS scale: 0/10 currently Pain location: Right proximal hamstring region Pain description: Tight Aggravating factors: Quick movements, tennis Relieving factors: Stretching, exercise  PRECAUTIONS: None  PATIENT GOALS: Get back to playing tennis   OBJECTIVE:  Note: Objective measures were completed at Evaluation unless otherwise noted. PATIENT SURVEYS:  LEFS: 61/80  MUSCLE LENGTH: Slight limitation in right hamstring length  PALPATION: No-tender to palpation  LUMBAR ROM:   AROM eval  Flexion   Extension  Right lateral flexion   Left lateral flexion   Right rotation   Left rotation    (Blank rows = not tested)  LOWER EXTREMITY ROM:      Hip and knee PROM grossly WFL  LOWER EXTREMITY MMT:    MMT Left eval Right eval Right 03/02/2024  Hip flexion     Hip extension 4 4-   Hip abduction 4 4-   Hip adduction     Hip internal rotation     Hip external rotation     Knee flexion 4+ 4 4  Knee extension     Ankle dorsiflexion     Ankle plantarflexion     Ankle inversion     Ankle eversion      (Blank rows = not tested)  FUNCTIONAL TESTS:  Squat: grossly WFL  GAIT: Assistive device utilized:  None Level of assistance: Complete Independence Comments: WFL   TREATMENT  [facility] Adult PT Treatment:                                                DATE: 03/02/2024 Recumbent bike L5 x 5 min to improve endurance and workload capacity Prone hamstring curl with green 3 x 6 Prone hamstring kicks with stability ball 3 x 30 sec Nordic hamstring curl 3 x 5 Dead lift with 30# 3 x 10 Developed, reviewed, and demonstrated a dynamic warm-up prior to playing tennis including forward and lateral leg swings, hamstring sweeps, body weight squats, lateral lunges, forward jog and back pedal, lateral shuffle  PATIENT EDUCATION:  Education details: HEP, dynamic warm-up Person educated: Patient Education method: Explanation, Demonstration, Tactile cues, Verbal cues Education comprehension: verbalized understanding, returned demonstration, verbal cues required, tactile cues required, and needs further education  HOME EXERCISE PROGRAM: Access Code: [email or web address]    ASSESSMENT: CLINICAL IMPRESSION: Patient tolerated therapy well with no adverse effects. Therapy focused on progressing hamstring strength and endurance, and developing a dynamic warm-up plan for patient prior to playing tennis. He does report the right hamstring continues to feels weaker compared to left but he denies any pain with exercises. Incorporated more endurance and ballistic hamstring movements with good tolerance. No changes to HEP but patient was provided a dynamic warm-up plan. Patient would benefit from continued skilled PT to progress mobility and strength in order to reduce pain and maximize functional ability.   Eval: Patient is a 63 y.o. male who was seen today for physical therapy evaluation and treatment for right posterior thigh pain consistent with chronic hamstring strain that seems to be doing much better currently. He does exhibit slight flexibility limitation of the right hamstring and strength deficit of the right hamstring  and glutes.   OBJECTIVE IMPAIRMENTS: decreased activity tolerance, decreased strength, impaired flexibility, and pain.   ACTIVITY LIMITATIONS: lifting, squatting, and locomotion level  PARTICIPATION LIMITATIONS: community activity  PERSONAL FACTORS: Time since onset of injury/illness/exacerbation are also affecting patient's functional outcome.    GOALS: Goals reviewed with patient? Yes  SHORT TERM GOALS: Target date: = LTG  LONG TERM GOALS: Target date: 04/07/2024  Patient will be I with final HEP to maintain progress from PT. Baseline: HEP provided at eval Goal status: INITIAL  2.  Patient will report LEFS >/= 75/80 in order to indicate improvement in his functional status Baseline: 61/80 Goal status: INITIAL  3.  Patient will demonstrate right hamstring  strength 5/5 MMT and hip strength >/= 4/5 MMT in order to improve tolerance for tennis related activities Baseline:  Goal status: INITIAL  4.  Patient will report ability to play tennis without increased pain or limitation to return to prior activity level Baseline: currently not playing tennis Goal status: INITIAL   PLAN: PT FREQUENCY: 1x/week  PT DURATION: 6 weeks  PLANNED INTERVENTIONS: 97164- PT Re-evaluation, 97750- Physical Performance Testing, 97110-Therapeutic exercises, 97530- Therapeutic activity, [id number]- Neuromuscular re-education, 97535- Self Care, 02859- Manual therapy, 20560 (1-2 muscles), 20561 (3+ muscles)- Dry Needling, Patient/Family education, Balance training, Cryotherapy, and Moist heat.  PLAN FOR NEXT SESSION: Review HEP and progress PRN, manual for right hamstring as indicated, progress right hamstring/hip/core strengthening, progress more dynamic balance and tennis related activities, plyometrics when indicated   Elaine Daring, PT, DPT, LAT, ATC 03/03/24  8:11 AM Phone: [phone] Fax: [phone]

## 2024-03-10 ENCOUNTER — Ambulatory Visit (INDEPENDENT_AMBULATORY_CARE_PROVIDER_SITE_OTHER): Admitting: Physical Therapy

## 2024-03-10 ENCOUNTER — Encounter: Payer: Self-pay | Admitting: Physical Therapy

## 2024-03-10 ENCOUNTER — Other Ambulatory Visit: Payer: Self-pay

## 2024-03-10 DIAGNOSIS — M6281 Muscle weakness (generalized): Secondary | ICD-10-CM

## 2024-03-10 DIAGNOSIS — M79604 Pain in right leg: Secondary | ICD-10-CM

## 2024-03-10 NOTE — Therapy (Signed)
 OUTPATIENT PHYSICAL THERAPY TREATMENT   Patient Name: Samuel Chapman MRN: [id number] DOB:[birth year], 63 y.o., male Today's Date: 03/10/2024   END OF SESSION:  PT End of Session - 03/10/24 1150     Visit Number 3    Number of Visits 6    Date for Recertification  04/07/24    Authorization Type MCR    Progress Note Due on Visit 10    PT Start Time 1146    PT Stop Time 1228    PT Time Calculation (min) 42 min    Activity Tolerance Patient tolerated treatment well    Behavior During Therapy [facility] for tasks assessed/performed            Past Medical History:  Diagnosis Date   Closed die punch fracture distal radius, right, initial encounter 04/25/2020   GERD (gastroesophageal reflux disease)    History of chicken pox    Influenza A 05/22/2018   Internal hemorrhoid    Low BP    post op    RAD (reactive airway disease)    ?of this vs mild asthma   Seasonal allergic rhinitis    tree pollen   Zenker's hypopharyngeal diverticulum 04/2015   Past Surgical History:  Procedure Laterality Date   COLONOSCOPY  01/20/2009   int hem, rpt 10 yrs [facility])   COLONOSCOPY  04/2019   WNL rpt 10 yrs [facility])   ESOPHAGOGASTRODUODENOSCOPY  2016   mod zenker's diverticulum, referred to tertiary center [facility])   HERNIA REPAIR  [phone], 1966   ZENKER'S DIVERTICULECTOMY ENDOSCOPIC  08/2015   Dr Alvan at [facility]   Patient Active Problem List   Diagnosis Date Noted   Zenker's diverticulum 04/22/2023   Hearing loss, left 01/03/2018   History of excision of Zenker's diverticulum 12/29/2015   HLD (hyperlipidemia) 12/25/2015   Health maintenance examination 12/27/2014   GERD (gastroesophageal reflux disease) 08/16/2014   Internal hemorrhoid 08/16/2014   Seasonal allergic rhinitis     PCP: Rilla Baller, MD  REFERRING PROVIDER: Joane Artist RAMAN, MD  REFERRING DIAG: Right thigh pain  Rationale for Evaluation and Treatment: Rehabilitation  THERAPY DIAG:  Pain in right leg  Muscle weakness  (generalized)  ONSET DATE: Chronic   SUBJECTIVE:        SUBJECTIVE STATEMENT: Patient reports he played tennis twice last week with coach just hitting and did fine.   Eval: Patient reports about 7-8 weeks he was carrying his dog up the stairs and slipped and jammed his right hip. He was told he had a torn hamstring. He reports getting better by the week and he does his stretches consistently. He reports he does get some pain or stiffness in the morning. He would like to get back to playing tennis, last played about 4 weeks ago. He also does pilates and does a trainer. He reports no pain with is his stretches or exercises.   PERTINENT HISTORY:  See PMH above  PAIN:  Are you having pain? Yes:  NPRS scale: 0/10 currently Pain location: Right proximal hamstring region Pain description: Tight Aggravating factors: Quick movements, tennis Relieving factors: Stretching, exercise  PRECAUTIONS: None  PATIENT GOALS: Get back to playing tennis   OBJECTIVE:  Note: Objective measures were completed at Evaluation unless otherwise noted. PATIENT SURVEYS:  LEFS: 61/80  MUSCLE LENGTH: Slight limitation in right hamstring length  PALPATION: No-tender to palpation  LUMBAR ROM:   AROM eval  Flexion   Extension   Right lateral flexion   Left lateral flexion  Right rotation   Left rotation    (Blank rows = not tested)  LOWER EXTREMITY ROM:      Hip and knee PROM grossly WFL  LOWER EXTREMITY MMT:    MMT Left eval Right eval Right 03/02/2024  Hip flexion     Hip extension 4 4-   Hip abduction 4 4-   Hip adduction     Hip internal rotation     Hip external rotation     Knee flexion 4+ 4 4  Knee extension     Ankle dorsiflexion     Ankle plantarflexion     Ankle inversion     Ankle eversion      (Blank rows = not tested)  FUNCTIONAL TESTS:  Squat: grossly WFL  GAIT: Assistive device utilized: None Level of assistance: Complete Independence Comments:  WFL   TREATMENT  [facility] Adult PT Treatment:                                                DATE: 03/10/2024 Recumbent bike L5 x 5 min to improve endurance and workload capacity Dynamic warm-up: Hamstring sweep, side shuffle, forward jog, back pedal, butt kicks, high knees, skip Bridge with hamstring curl on stability ball 2 x 10 SL bridge 2 x 10 each SL RDL with 15# 2 x 10 each Forward 10 runner step-up 2 x 10 each  Discussed continued gradual progression of tennis activity  PATIENT EDUCATION:  Education details: HEP Person educated: Patient Education method: Programmer, Multimedia, Demonstration, Actor cues, Verbal cues Education comprehension: verbalized understanding, returned demonstration, verbal cues required, tactile cues required, and needs further education  HOME EXERCISE PROGRAM: Access Code: [email or web address]    ASSESSMENT: CLINICAL IMPRESSION: Patient tolerated therapy well with no adverse effects. Therapy focused on progressing activity tolerance regarding the right hamstring. He was able to perform a dynamic warm-up consisting of more plyometric and ballistic movements without any pain or limitations. He was able to progress with some hamstring strength and control exercises with good tolerance. Continued discussion of gradual progression of tennis activities and incorporating more lateral and forward/backward movements of short distances. No changes made to his HEP this visit. Patient would benefit from continued skilled PT to progress mobility and strength in order to reduce pain and maximize functional ability.   Eval: Patient is a 63 y.o. male who was seen today for physical therapy evaluation and treatment for right posterior thigh pain consistent with chronic hamstring strain that seems to be doing much better currently. He does exhibit slight flexibility limitation of the right hamstring and strength deficit of the right hamstring and glutes.   OBJECTIVE IMPAIRMENTS: decreased  activity tolerance, decreased strength, impaired flexibility, and pain.   ACTIVITY LIMITATIONS: lifting, squatting, and locomotion level  PARTICIPATION LIMITATIONS: community activity  PERSONAL FACTORS: Time since onset of injury/illness/exacerbation are also affecting patient's functional outcome.    GOALS: Goals reviewed with patient? Yes  SHORT TERM GOALS: Target date: = LTG  LONG TERM GOALS: Target date: 04/07/2024  Patient will be I with final HEP to maintain progress from PT. Baseline: HEP provided at eval Goal status: INITIAL  2.  Patient will report LEFS >/= 75/80 in order to indicate improvement in his functional status Baseline: 61/80 Goal status: INITIAL  3.  Patient will demonstrate right hamstring strength 5/5 MMT and hip strength >/= 4/5 MMT in  order to improve tolerance for tennis related activities Baseline:  Goal status: INITIAL  4.  Patient will report ability to play tennis without increased pain or limitation to return to prior activity level Baseline: currently not playing tennis Goal status: INITIAL   PLAN: PT FREQUENCY: 1x/week  PT DURATION: 6 weeks  PLANNED INTERVENTIONS: 97164- PT Re-evaluation, 97750- Physical Performance Testing, 97110-Therapeutic exercises, 97530- Therapeutic activity, [id number]- Neuromuscular re-education, 97535- Self Care, 02859- Manual therapy, 20560 (1-2 muscles), 20561 (3+ muscles)- Dry Needling, Patient/Family education, Balance training, Cryotherapy, and Moist heat.  PLAN FOR NEXT SESSION: Review HEP and progress PRN, manual for right hamstring as indicated, progress right hamstring/hip/core strengthening, progress more dynamic balance and tennis related activities, plyometrics when indicated   Elaine Daring, PT, DPT, LAT, ATC 03/10/24  12:42 PM Phone: [phone] Fax: [phone]

## 2024-03-16 ENCOUNTER — Other Ambulatory Visit: Payer: Self-pay

## 2024-03-16 ENCOUNTER — Ambulatory Visit (INDEPENDENT_AMBULATORY_CARE_PROVIDER_SITE_OTHER): Admitting: Physical Therapy

## 2024-03-16 ENCOUNTER — Encounter: Payer: Self-pay | Admitting: Physical Therapy

## 2024-03-16 DIAGNOSIS — M79604 Pain in right leg: Secondary | ICD-10-CM

## 2024-03-16 DIAGNOSIS — M6281 Muscle weakness (generalized): Secondary | ICD-10-CM | POA: Diagnosis not present

## 2024-03-16 NOTE — Therapy (Signed)
 OUTPATIENT PHYSICAL THERAPY TREATMENT   Patient Name: Samuel Chapman MRN: [id number] DOB:July 01, 1960, 63 y.o., male Today's Date: 03/16/2024   END OF SESSION:  PT End of Session - 03/16/24 1526     Visit Number 4    Number of Visits 6    Date for Recertification  04/07/24    Authorization Type MCR    Progress Note Due on Visit 10    PT Start Time 1520    PT Stop Time 1600    PT Time Calculation (min) 40 min    Activity Tolerance Patient tolerated treatment well    Behavior During Therapy [facility] for tasks assessed/performed             Past Medical History:  Diagnosis Date   Closed die punch fracture distal radius, right, initial encounter 04/25/2020   GERD (gastroesophageal reflux disease)    History of chicken pox    Influenza A 05/22/2018   Internal hemorrhoid    Low BP    post op    RAD (reactive airway disease)    ?of this vs mild asthma   Seasonal allergic rhinitis    tree pollen   Zenker's hypopharyngeal diverticulum 04/2015   Past Surgical History:  Procedure Laterality Date   COLONOSCOPY  01/20/2009   int hem, rpt 10 yrs [facility])   COLONOSCOPY  04/2019   WNL rpt 10 yrs [facility])   ESOPHAGOGASTRODUODENOSCOPY  2016   mod zenker's diverticulum, referred to tertiary center [facility])   HERNIA REPAIR  [phone], 1966   ZENKER'S DIVERTICULECTOMY ENDOSCOPIC  08/2015   Dr Alvan at [facility]   Patient Active Problem List   Diagnosis Date Noted   Zenker's diverticulum 04/22/2023   Hearing loss, left 01/03/2018   History of excision of Zenker's diverticulum 12/29/2015   HLD (hyperlipidemia) 12/25/2015   Health maintenance examination 12/27/2014   GERD (gastroesophageal reflux disease) 08/16/2014   Internal hemorrhoid 08/16/2014   Seasonal allergic rhinitis     PCP: Rilla Baller, MD  REFERRING PROVIDER: Joane Artist RAMAN, MD  REFERRING DIAG: Right thigh pain  Rationale for Evaluation and Treatment: Rehabilitation  THERAPY DIAG:  Pain in right leg  Muscle weakness  (generalized)  ONSET DATE: Chronic   SUBJECTIVE:        SUBJECTIVE STATEMENT: Patient reports he went to [facility] and did a lot of walking without any issue. He did go to the gym and play tennis earlier today.  Eval: Patient reports about 7-8 weeks he was carrying his dog up the stairs and slipped and jammed his right hip. He was told he had a torn hamstring. He reports getting better by the week and he does his stretches consistently. He reports he does get some pain or stiffness in the morning. He would like to get back to playing tennis, last played about 4 weeks ago. He also does pilates and does a trainer. He reports no pain with is his stretches or exercises.   PERTINENT HISTORY:  See PMH above  PAIN:  Are you having pain? Yes:  NPRS scale: 0/10 currently Pain location: Right proximal hamstring region Pain description: Tight Aggravating factors: Quick movements, tennis Relieving factors: Stretching, exercise  PRECAUTIONS: None  PATIENT GOALS: Get back to playing tennis   OBJECTIVE:  Note: Objective measures were completed at Evaluation unless otherwise noted. PATIENT SURVEYS:  LEFS: 61/80  MUSCLE LENGTH: Slight limitation in right hamstring length  PALPATION: No-tender to palpation  LUMBAR ROM:   AROM eval  Flexion   Extension  Right lateral flexion   Left lateral flexion   Right rotation   Left rotation    (Blank rows = not tested)  LOWER EXTREMITY ROM:      Hip and knee PROM grossly WFL  LOWER EXTREMITY MMT:    MMT Left eval Right eval Right 03/02/2024  Hip flexion     Hip extension 4 4-   Hip abduction 4 4-   Hip adduction     Hip internal rotation     Hip external rotation     Knee flexion 4+ 4 4  Knee extension     Ankle dorsiflexion     Ankle plantarflexion     Ankle inversion     Ankle eversion      (Blank rows = not tested)  FUNCTIONAL TESTS:  Squat: grossly WFL  GAIT: Assistive device utilized: None Level of assistance: Complete  Independence Comments: WFL   TREATMENT  [facility] Adult PT Treatment:                                                DATE: 03/16/2024 Recumbent bike L4 x 5 min to improve endurance and workload capacity Bridge with hamstring curl on stability ball 3 x 10 Nordic hamstring 3 x 5 Hamstring glider 3 x 6 Seated hamstring curl with blue 3 x 10 Deadlift with 40# 3 x 8  PATIENT EDUCATION:  Education details: HEP Person educated: Patient Education method: Programmer, Multimedia, Demonstration, Actor cues, Verbal cues Education comprehension: verbalized understanding, returned demonstration, verbal cues required, tactile cues required, and needs further education  HOME EXERCISE PROGRAM: Access Code: [email or web address]    ASSESSMENT: CLINICAL IMPRESSION: Patient tolerated therapy well with no adverse effects. Therapy focused on progressing hamstring strength and control with good tolerance. Incorporated more eccentric hamstring strengthening and he did report feeling a little weakness of the right hamstring but denies pain. He seems to be progressing well back in to tennis and is participating in his gym exercises and pilates without issue. No changes made to HEP this visit. Patient would benefit from continued skilled PT to progress mobility and strength in order to reduce pain and maximize functional ability.   Eval: Patient is a 63 y.o. male who was seen today for physical therapy evaluation and treatment for right posterior thigh pain consistent with chronic hamstring strain that seems to be doing much better currently. He does exhibit slight flexibility limitation of the right hamstring and strength deficit of the right hamstring and glutes.   OBJECTIVE IMPAIRMENTS: decreased activity tolerance, decreased strength, impaired flexibility, and pain.   ACTIVITY LIMITATIONS: lifting, squatting, and locomotion level  PARTICIPATION LIMITATIONS: community activity  PERSONAL FACTORS: Time since onset of  injury/illness/exacerbation are also affecting patient's functional outcome.    GOALS: Goals reviewed with patient? Yes  SHORT TERM GOALS: Target date: = LTG  LONG TERM GOALS: Target date: 04/07/2024  Patient will be I with final HEP to maintain progress from PT. Baseline: HEP provided at eval Goal status: INITIAL  2.  Patient will report LEFS >/= 75/80 in order to indicate improvement in his functional status Baseline: 61/80 Goal status: INITIAL  3.  Patient will demonstrate right hamstring strength 5/5 MMT and hip strength >/= 4/5 MMT in order to improve tolerance for tennis related activities Baseline:  Goal status: INITIAL  4.  Patient will report ability to play tennis  without increased pain or limitation to return to prior activity level Baseline: currently not playing tennis Goal status: INITIAL   PLAN: PT FREQUENCY: 1x/week  PT DURATION: 6 weeks  PLANNED INTERVENTIONS: 97164- PT Re-evaluation, 97750- Physical Performance Testing, 97110-Therapeutic exercises, 97530- Therapeutic activity, [id number]- Neuromuscular re-education, 97535- Self Care, 02859- Manual therapy, 20560 (1-2 muscles), 20561 (3+ muscles)- Dry Needling, Patient/Family education, Balance training, Cryotherapy, and Moist heat.  PLAN FOR NEXT SESSION: Review HEP and progress PRN, manual for right hamstring as indicated, progress right hamstring/hip/core strengthening, progress more dynamic balance and tennis related activities, plyometrics when indicated   Elaine Daring, PT, DPT, LAT, ATC 03/16/24  4:10 PM Phone: [phone] Fax: [phone]

## 2024-03-17 ENCOUNTER — Other Ambulatory Visit: Payer: Self-pay | Admitting: Physician Assistant

## 2024-03-23 ENCOUNTER — Other Ambulatory Visit: Payer: Self-pay

## 2024-03-23 ENCOUNTER — Encounter: Payer: Self-pay | Admitting: Physical Therapy

## 2024-03-23 ENCOUNTER — Ambulatory Visit: Admitting: Physical Therapy

## 2024-03-23 DIAGNOSIS — M6281 Muscle weakness (generalized): Secondary | ICD-10-CM | POA: Diagnosis not present

## 2024-03-23 DIAGNOSIS — M79604 Pain in right leg: Secondary | ICD-10-CM | POA: Diagnosis not present

## 2024-03-23 NOTE — Therapy (Signed)
 OUTPATIENT PHYSICAL THERAPY TREATMENT   Patient Name: Samuel Chapman MRN: [id number] DOB:Nov 30, 1960, 63 y.o., male Today's Date: 03/23/2024   END OF SESSION:  PT End of Session - 03/23/24 1523     Visit Number 5    Number of Visits 6    Date for Recertification  04/07/24    Authorization Type MCR    Progress Note Due on Visit 10    PT Start Time 1518    PT Stop Time 1600    PT Time Calculation (min) 42 min    Activity Tolerance Patient tolerated treatment well    Behavior During Therapy [facility] for tasks assessed/performed              Past Medical History:  Diagnosis Date   Closed die punch fracture distal radius, right, initial encounter 04/25/2020   GERD (gastroesophageal reflux disease)    History of chicken pox    Influenza A 05/22/2018   Internal hemorrhoid    Low BP    post op    RAD (reactive airway disease)    ?of this vs mild asthma   Seasonal allergic rhinitis    tree pollen   Zenker's hypopharyngeal diverticulum 04/2015   Past Surgical History:  Procedure Laterality Date   COLONOSCOPY  01/20/2009   int hem, rpt 10 yrs [facility])   COLONOSCOPY  04/2019   WNL rpt 10 yrs [facility])   ESOPHAGOGASTRODUODENOSCOPY  2016   mod zenker's diverticulum, referred to tertiary center [facility])   HERNIA REPAIR  [phone], 1966   ZENKER'S DIVERTICULECTOMY ENDOSCOPIC  08/2015   Dr Alvan at [facility]   Patient Active Problem List   Diagnosis Date Noted   Zenker's diverticulum 04/22/2023   Hearing loss, left 01/03/2018   History of excision of Zenker's diverticulum 12/29/2015   HLD (hyperlipidemia) 12/25/2015   Health maintenance examination 12/27/2014   GERD (gastroesophageal reflux disease) 08/16/2014   Internal hemorrhoid 08/16/2014   Seasonal allergic rhinitis     PCP: Rilla Baller, MD  REFERRING PROVIDER: Joane Artist RAMAN, MD  REFERRING DIAG: Right thigh pain  Rationale for Evaluation and Treatment: Rehabilitation  THERAPY DIAG:  Pain in right leg  Muscle  weakness (generalized)  ONSET DATE: Chronic   SUBJECTIVE:        SUBJECTIVE STATEMENT: Patient reports he his feeling better and more confident with his tennis, and is starting to get back to his usual exercise routine at home and the gym.  Eval: Patient reports about 7-8 weeks he was carrying his dog up the stairs and slipped and jammed his right hip. He was told he had a torn hamstring. He reports getting better by the week and he does his stretches consistently. He reports he does get some pain or stiffness in the morning. He would like to get back to playing tennis, last played about 4 weeks ago. He also does pilates and does a trainer. He reports no pain with is his stretches or exercises.   PERTINENT HISTORY:  See PMH above  PAIN:  Are you having pain? Yes:  NPRS scale: 0/10 currently Pain location: Right proximal hamstring region Pain description: Tight Aggravating factors: Quick movements, tennis Relieving factors: Stretching, exercise  PRECAUTIONS: None  PATIENT GOALS: Get back to playing tennis   OBJECTIVE:  Note: Objective measures were completed at Evaluation unless otherwise noted. PATIENT SURVEYS:  LEFS: 61/80  MUSCLE LENGTH: Slight limitation in right hamstring length  PALPATION: No-tender to palpation  LUMBAR ROM:   AROM eval  Flexion  Extension   Right lateral flexion   Left lateral flexion   Right rotation   Left rotation    (Blank rows = not tested)  LOWER EXTREMITY ROM:      Hip and knee PROM grossly WFL  LOWER EXTREMITY MMT:    MMT Left eval Right eval Right 03/02/2024  Hip flexion     Hip extension 4 4-   Hip abduction 4 4-   Hip adduction     Hip internal rotation     Hip external rotation     Knee flexion 4+ 4 4  Knee extension     Ankle dorsiflexion     Ankle plantarflexion     Ankle inversion     Ankle eversion      (Blank rows = not tested)  FUNCTIONAL TESTS:  Squat: grossly WFL  GAIT: Assistive device utilized:  None Level of assistance: Complete Independence Comments: WFL   TREATMENT  [facility] Adult PT Treatment:                                                DATE: 03/23/2024 Recumbent bike L4 x 5 min to improve endurance and workload capacity Deadlift with 20# x 10, 30# x 10, 40# x 8, 50# 3 x 6 KB swing with 20# 3 x 15 Runner step-up 2 x 10 Skater jump 3 x 20 Med ball rotational eccentric throw with 12# 3 x 8 each  PATIENT EDUCATION:  Education details: HEP Person educated: Patient Education method: Programmer, Multimedia, Demonstration, Actor cues, Verbal cues Education comprehension: verbalized understanding, returned demonstration, verbal cues required, tactile cues required, and needs further education  HOME EXERCISE PROGRAM: Access Code: [email or web address]    ASSESSMENT: CLINICAL IMPRESSION: Patient tolerated therapy well with no adverse effects. Therapy continued to progress hamstring strengthening and incorporated more explosive and plyometric movements this visit. He continues to progress with his exercises and denies any pain. He did require cueing for proper exercise technique especially with skater jumps for proper landing. No changes made to his HEP and encouraged to continue progression back to activity and tennis to prior level before injury. Patient would benefit from continued skilled PT to progress mobility and strength in order to reduce pain and maximize functional ability.   Eval: Patient is a 63 y.o. male who was seen today for physical therapy evaluation and treatment for right posterior thigh pain consistent with chronic hamstring strain that seems to be doing much better currently. He does exhibit slight flexibility limitation of the right hamstring and strength deficit of the right hamstring and glutes.   OBJECTIVE IMPAIRMENTS: decreased activity tolerance, decreased strength, impaired flexibility, and pain.   ACTIVITY LIMITATIONS: lifting, squatting, and locomotion  level  PARTICIPATION LIMITATIONS: community activity  PERSONAL FACTORS: Time since onset of injury/illness/exacerbation are also affecting patient's functional outcome.    GOALS: Goals reviewed with patient? Yes  SHORT TERM GOALS: Target date: = LTG  LONG TERM GOALS: Target date: 04/07/2024  Patient will be I with final HEP to maintain progress from PT. Baseline: HEP provided at eval Goal status: INITIAL  2.  Patient will report LEFS >/= 75/80 in order to indicate improvement in his functional status Baseline: 61/80 Goal status: INITIAL  3.  Patient will demonstrate right hamstring strength 5/5 MMT and hip strength >/= 4/5 MMT in order to improve tolerance for tennis related activities  Baseline:  Goal status: INITIAL  4.  Patient will report ability to play tennis without increased pain or limitation to return to prior activity level Baseline: currently not playing tennis Goal status: INITIAL   PLAN: PT FREQUENCY: 1x/week  PT DURATION: 6 weeks  PLANNED INTERVENTIONS: 97164- PT Re-evaluation, 97750- Physical Performance Testing, 97110-Therapeutic exercises, 97530- Therapeutic activity, [id number]- Neuromuscular re-education, 97535- Self Care, 02859- Manual therapy, 20560 (1-2 muscles), 20561 (3+ muscles)- Dry Needling, Patient/Family education, Balance training, Cryotherapy, and Moist heat.  PLAN FOR NEXT SESSION: Review HEP and progress PRN, manual for right hamstring as indicated, progress right hamstring/hip/core strengthening, progress more dynamic balance and tennis related activities, plyometrics when indicated   Elaine Daring, PT, DPT, LAT, ATC 03/23/24  4:02 PM Phone: [phone] Fax: [phone]

## 2024-03-25 ENCOUNTER — Ambulatory Visit (INDEPENDENT_AMBULATORY_CARE_PROVIDER_SITE_OTHER): Admitting: Family Medicine

## 2024-03-25 VITALS — BP 130/60 | HR 95 | Ht 66.5 in | Wt 156.8 lb

## 2024-03-25 DIAGNOSIS — M79651 Pain in right thigh: Secondary | ICD-10-CM

## 2024-03-25 NOTE — Patient Instructions (Signed)
 Thank you for coming in today.   Advance into more tennis activity, as tolerated  Check back with me as needed

## 2024-03-25 NOTE — Progress Notes (Signed)
   I, [name] am a scribe for Dr. Artist Lloyd, MD.  Samuel Chapman is a 63 y.o. male who presents to [facility] today for f/u R thigh pain. Pt was last seen by Dr. Lloyd on 02/12/24 and was advised to work on HEP and was referred to PT, completing 5 visits.  Today, pt reports that the thigh is doing great. Been working with Dr. Elaine and doesn't think there is much more to do.   Pertinent review of systems: No fevers or chills  Relevant historical information: GERD   Exam:  BP 130/60   Pulse 95   Ht 5' 6.5 (1.689 m)   Wt 156 lb 12.8 oz (71.1 kg)   SpO2 98%   BMI 24.93 kg/m  General: Well Developed, well nourished, and in no acute distress.   MSK: Right hamstring normal-appearing intact strength normal gait    Lab and Radiology Results No results found for this or any previous visit (from the past 72 hours). No results found.     Assessment and Plan: 63 y.o. male with improved pain from right hamstring strain with physical therapy.  Continue home exercise program.  We talked about return to play strategies.  Check back as needed.   PDMP not reviewed this encounter. No orders of the defined types were placed in this encounter.  No orders of the defined types were placed in this encounter.    Discussed warning signs or symptoms. Please see discharge instructions. Patient expresses understanding.   The above documentation has been reviewed and is accurate and complete Artist Lloyd, M.D.

## 2024-04-06 ENCOUNTER — Encounter: Payer: Self-pay | Admitting: Physical Therapy

## 2024-04-06 ENCOUNTER — Other Ambulatory Visit: Payer: Self-pay

## 2024-04-06 ENCOUNTER — Ambulatory Visit (INDEPENDENT_AMBULATORY_CARE_PROVIDER_SITE_OTHER): Admitting: Physical Therapy

## 2024-04-06 DIAGNOSIS — M79604 Pain in right leg: Secondary | ICD-10-CM

## 2024-04-06 DIAGNOSIS — M6281 Muscle weakness (generalized): Secondary | ICD-10-CM | POA: Diagnosis not present

## 2024-04-06 NOTE — Therapy (Signed)
 OUTPATIENT PHYSICAL THERAPY TREATMENT  DISCHARGE   Patient Name: Samuel Chapman MRN: [id number] DOB:1961-03-02, 63 y.o., male Today's Date: 04/06/2024   END OF SESSION:  PT End of Session - 04/06/24 1445     Visit Number 6    Number of Visits 6    Date for Recertification  04/07/24    Authorization Type MCR    Progress Note Due on Visit 10    PT Start Time 1433    PT Stop Time 1515    PT Time Calculation (min) 42 min    Activity Tolerance Patient tolerated treatment well    Behavior During Therapy [facility] for tasks assessed/performed               Past Medical History:  Diagnosis Date   Closed die punch fracture distal radius, right, initial encounter 04/25/2020   GERD (gastroesophageal reflux disease)    History of chicken pox    Influenza A 05/22/2018   Internal hemorrhoid    Low BP    post op    RAD (reactive airway disease)    ?of this vs mild asthma   Seasonal allergic rhinitis    tree pollen   Zenker's hypopharyngeal diverticulum 04/2015   Past Surgical History:  Procedure Laterality Date   COLONOSCOPY  01/20/2009   int hem, rpt 10 yrs [facility])   COLONOSCOPY  04/2019   WNL rpt 10 yrs [facility])   ESOPHAGOGASTRODUODENOSCOPY  2016   mod zenker's diverticulum, referred to tertiary center [facility])   HERNIA REPAIR  [phone], 1966   ZENKER'S DIVERTICULECTOMY ENDOSCOPIC  08/2015   Dr Alvan at [facility]   Patient Active Problem List   Diagnosis Date Noted   Zenker's diverticulum 04/22/2023   Hearing loss, left 01/03/2018   History of excision of Zenker's diverticulum 12/29/2015   HLD (hyperlipidemia) 12/25/2015   Health maintenance examination 12/27/2014   GERD (gastroesophageal reflux disease) 08/16/2014   Internal hemorrhoid 08/16/2014   Seasonal allergic rhinitis     PCP: Rilla Baller, MD  REFERRING PROVIDER: Joane Artist RAMAN, MD  REFERRING DIAG: Right thigh pain  Rationale for Evaluation and Treatment: Rehabilitation  THERAPY DIAG:  Pain in right  leg  Muscle weakness (generalized)  ONSET DATE: Chronic   SUBJECTIVE:        SUBJECTIVE STATEMENT: Patient reports He played a real tennis game since last visit and didn't have any issues when stretched it out. He states exercises have been going well, still has been doing his pilates and working with a systems analyst.   Eval: Patient reports about 7-8 weeks he was carrying his dog up the stairs and slipped and jammed his right hip. He was told he had a torn hamstring. He reports getting better by the week and he does his stretches consistently. He reports he does get some pain or stiffness in the morning. He would like to get back to playing tennis, last played about 4 weeks ago. He also does pilates and does a trainer. He reports no pain with is his stretches or exercises.   PERTINENT HISTORY:  See PMH above  PAIN:  Are you having pain? No NPRS scale: 0/10 Pain location: Right proximal hamstring region Pain description: Tight Aggravating factors: Quick movements, tennis Relieving factors: Stretching, exercise  PRECAUTIONS: None  PATIENT GOALS: Get back to playing tennis   OBJECTIVE:  Note: Objective measures were completed at Evaluation unless otherwise noted. PATIENT SURVEYS:  LEFS: 61/80  04/06/2024: 79/80  MUSCLE LENGTH: Slight limitation in right hamstring  length  PALPATION: No-tender to palpation  LUMBAR ROM:   AROM eval  Flexion   Extension   Right lateral flexion   Left lateral flexion   Right rotation   Left rotation    (Blank rows = not tested)  LOWER EXTREMITY ROM:      Hip and knee PROM grossly [facility]  LOWER EXTREMITY MMT:    MMT Left eval Right eval Right 03/02/2024 Right 04/07/2024  Hip flexion      Hip extension 4 4-  4  Hip abduction 4 4-  4  Hip adduction      Hip internal rotation      Hip external rotation      Knee flexion 4+ 4 4 5   Knee extension      Ankle dorsiflexion      Ankle plantarflexion      Ankle inversion       Ankle eversion       (Blank rows = not tested)  FUNCTIONAL TESTS:  Squat: grossly WFL  GAIT: Assistive device utilized: None Level of assistance: Complete Independence Comments: WFL   TREATMENT  [facility] Adult PT Treatment:                                                DATE: 04/06/2024 Recumbent bike L4 x 5 min to improve endurance and workload capacity Deadlift with 30# x 10, 50# 3 x 8 Squat with 12# med ball toss against wall x 10 Med ball 12# underhand vertical toss x 10 Forward 12 runner step-up x 10 each Crossover 12 step-up x 10 each Med ball 12# rotational throw 2 x 8 each  PATIENT EDUCATION:  Education details: POC discharge, HEP Person educated: Patient Education method: Explanation Education comprehension: Verbalized understanding  HOME EXERCISE PROGRAM: Access Code: [email or web address]    ASSESSMENT: CLINICAL IMPRESSION: Patient tolerated therapy well with no adverse effects. He has made great progress in PT and has returned to playing tennis without report of pain with tennis or daily activities. He demonstrates much improved strength and tolerated plyometric and explosive power movements this visit without pain or limitation. He is independent with his HEP and consistent with his personal trainer and pilates. Patient will be formally discharged from PT at this time.   Eval: Patient is a 63 y.o. male who was seen today for physical therapy evaluation and treatment for right posterior thigh pain consistent with chronic hamstring strain that seems to be doing much better currently. He does exhibit slight flexibility limitation of the right hamstring and strength deficit of the right hamstring and glutes.   OBJECTIVE IMPAIRMENTS: decreased activity tolerance, decreased strength, impaired flexibility, and pain.   ACTIVITY LIMITATIONS: lifting, squatting, and locomotion level  PARTICIPATION LIMITATIONS: community activity  PERSONAL FACTORS: Time since onset of  injury/illness/exacerbation are also affecting patient's functional outcome.    GOALS: Goals reviewed with patient? Yes  SHORT TERM GOALS: Target date: = LTG  LONG TERM GOALS: Target date: 04/07/2024  Patient will be I with final HEP to maintain progress from PT. Baseline: HEP provided at eval 04/06/2024: independent Goal status: MET  2.  Patient will report LEFS >/= 75/80 in order to indicate improvement in his functional status Baseline: 61/80 04/06/2024: 79/80 Goal status: MET  3.  Patient will demonstrate right hamstring strength 5/5 MMT and hip strength >/= 4/5 MMT in order  to improve tolerance for tennis related activities Baseline:  04/06/2024: see above Goal status: MET  4.  Patient will report ability to play tennis without increased pain or limitation to return to prior activity level Baseline: currently not playing tennis 04/06/2024: reports he is playing tennis Goal status: MET   PLAN: PT FREQUENCY: 1x/week  PT DURATION: 6 weeks  PLANNED INTERVENTIONS: 97164- PT Re-evaluation, 97750- Physical Performance Testing, 97110-Therapeutic exercises, 97530- Therapeutic activity, [id number]- Neuromuscular re-education, 97535- Self Care, 02859- Manual therapy, 20560 (1-2 muscles), 20561 (3+ muscles)- Dry Needling, Patient/Family education, Balance training, Cryotherapy, and Moist heat.  PLAN FOR NEXT SESSION: NA - discharge   Elaine Daring, PT, DPT, LAT, ATC 04/06/24  3:19 PM Phone: [phone] Fax: [phone]   PHYSICAL THERAPY DISCHARGE SUMMARY  Visits from Start of Care: 6  Current functional level related to goals / functional outcomes: See above   Remaining deficits: See above   Education / Equipment: HEP   Patient agrees to discharge. Patient goals were met. Patient is being discharged due to meeting the stated rehab goals.

## 2024-04-17 ENCOUNTER — Encounter: Payer: BC Managed Care – PPO | Admitting: Family Medicine

## 2024-04-17 ENCOUNTER — Other Ambulatory Visit: Payer: BC Managed Care – PPO

## 2024-04-18 ENCOUNTER — Other Ambulatory Visit: Payer: Self-pay | Admitting: Family Medicine

## 2024-04-18 DIAGNOSIS — E785 Hyperlipidemia, unspecified: Secondary | ICD-10-CM

## 2024-04-18 DIAGNOSIS — Z125 Encounter for screening for malignant neoplasm of prostate: Secondary | ICD-10-CM

## 2024-04-20 ENCOUNTER — Other Ambulatory Visit (INDEPENDENT_AMBULATORY_CARE_PROVIDER_SITE_OTHER)

## 2024-04-20 ENCOUNTER — Encounter: Payer: Self-pay | Admitting: Family Medicine

## 2024-04-20 DIAGNOSIS — Z125 Encounter for screening for malignant neoplasm of prostate: Secondary | ICD-10-CM | POA: Diagnosis not present

## 2024-04-20 DIAGNOSIS — E785 Hyperlipidemia, unspecified: Secondary | ICD-10-CM | POA: Diagnosis not present

## 2024-04-20 LAB — COMPREHENSIVE METABOLIC PANEL WITH GFR
ALT: 18 U/L (ref 0–53)
AST: 20 U/L (ref 0–37)
Albumin: 4.5 g/dL (ref 3.5–5.2)
Alkaline Phosphatase: 62 U/L (ref 39–117)
BUN: 15 mg/dL (ref 6–23)
CO2: 29 meq/L (ref 19–32)
Calcium: 9.5 mg/dL (ref 8.4–10.5)
Chloride: 102 meq/L (ref 96–112)
Creatinine, Ser: 0.86 mg/dL (ref 0.40–1.50)
GFR: 90.98 mL/min (ref >60.00)
Glucose, Bld: 103 mg/dL — ABNORMAL HIGH (ref 70–99)
Potassium: 4.5 meq/L (ref 3.5–5.1)
Sodium: 140 meq/L (ref 135–145)
Total Bilirubin: 0.5 mg/dL (ref 0.2–1.2)
Total Protein: 7 g/dL (ref 6.0–8.3)

## 2024-04-20 LAB — LIPID PANEL
Cholesterol: 218 mg/dL — ABNORMAL HIGH (ref 0–200)
HDL: 75.1 mg/dL (ref >39.00)
LDL Cholesterol: 127 mg/dL — ABNORMAL HIGH (ref 0–99)
NonHDL: 142.43
Total CHOL/HDL Ratio: 3
Triglycerides: 79 mg/dL (ref 0.0–149.0)
VLDL: 15.8 mg/dL (ref 0.0–40.0)

## 2024-04-20 LAB — PSA, MEDICARE: PSA: 1.13 ng/mL (ref 0.10–4.00)

## 2024-04-21 ENCOUNTER — Ambulatory Visit: Payer: Self-pay | Admitting: Family Medicine

## 2024-04-24 ENCOUNTER — Ambulatory Visit: Payer: BC Managed Care – PPO | Admitting: Family Medicine

## 2024-04-24 ENCOUNTER — Encounter: Payer: Self-pay | Admitting: Family Medicine

## 2024-04-24 VITALS — BP 116/80 | HR 80 | Temp 97.9°F | Ht 66.5 in | Wt 155.0 lb

## 2024-04-24 DIAGNOSIS — K219 Gastro-esophageal reflux disease without esophagitis: Secondary | ICD-10-CM | POA: Diagnosis not present

## 2024-04-24 DIAGNOSIS — E785 Hyperlipidemia, unspecified: Secondary | ICD-10-CM | POA: Diagnosis not present

## 2024-04-24 DIAGNOSIS — Z8719 Personal history of other diseases of the digestive system: Secondary | ICD-10-CM

## 2024-04-24 DIAGNOSIS — H9193 Unspecified hearing loss, bilateral: Secondary | ICD-10-CM | POA: Diagnosis not present

## 2024-04-24 DIAGNOSIS — Z0000 Encounter for general adult medical examination without abnormal findings: Secondary | ICD-10-CM | POA: Insufficient documentation

## 2024-04-24 DIAGNOSIS — J301 Allergic rhinitis due to pollen: Secondary | ICD-10-CM | POA: Diagnosis not present

## 2024-04-24 DIAGNOSIS — Z7189 Other specified counseling: Secondary | ICD-10-CM | POA: Insufficient documentation

## 2024-04-24 DIAGNOSIS — Z9889 Other specified postprocedural states: Secondary | ICD-10-CM | POA: Diagnosis not present

## 2024-04-24 NOTE — Telephone Encounter (Signed)
Noted.  Discussed at OV today.

## 2024-04-24 NOTE — Patient Instructions (Addendum)
 EKG today  For nasal congestion - start flonase 1-2 squirts into each nostril daily.  Work on [employer] choices to improve cholesterol levels.  Good to see you today Return as needed or in 1 year for next physical /wellness visit.

## 2024-04-24 NOTE — Progress Notes (Unsigned)
 Chief Complaint  Patient presents with   Annual Exam    Right nasal cavity gets congested every night, clear/green nasal discharge, onset 6- 8 months   Medicare Wellness    Welcome to medicare      Subjective:   Samuel Chapman is a 63 y.o. male who presents for a Welcome to Medicare Exam.   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) Persons participating in visit and providing information:: patient Medicare Wellness Visit Mode:: In-person (required for WTM) Interpreter Needed?: No Pre-visit prep was completed: yes AWV questionnaire completed by patient prior to visit?: no Living arrangements:: lives with spouse/significant other Patient's Overall Health Status Rating: very good Typical amount of pain: none Does pain affect daily life?: no Are you currently prescribed opioids?: no  Dietary Habits and Nutritional Risks How many meals a day?: 3 Eats fruit and vegetables daily?: yes Most meals are obtained by: preparing own meals In the last 2 weeks, have you had any of the following?: none Diabetic:: no  Functional Status Activities of Daily Living (to include ambulation/medication): Independent Ambulation: Independent Medication Administration: Independent Home Management (perform basic housework or laundry): Independent Manage your own finances?: yes Primary transportation is: driving Concerns about vision?: no *vision screening is required for WTM* Concerns about hearing?: no  Fall Screening Falls in the past year?: 1 Number of falls in past year: 1 Was there an injury with Fall?: 1 Fall Risk Category Calculator: 3 Patient Fall Risk Level: High Fall Risk  Fall Risk Patient at Risk for Falls Due to: No Fall Risks Fall risk Follow up: Falls evaluation completed  Home and Transportation Safety: All rugs have non-skid backing?: yes All stairs or steps have railings?: yes Grab bars in the bathtub or shower?: (!) no Have  non-skid surface in bathtub or shower?: (!) no Good home lighting?: yes Regular seat belt use?: yes Hospital stays in the last year:: (!) yes How many hospital stays:: 1 Reason: s/p surgery  Cognitive Assessment Difficulty concentrating, remembering, or making decisions? : no Will 6CIT or Mini Cog be Completed: yes What year is it?: 0 points What month is it?: 0 points Give patient an address phrase to remember (5 components): the boy rode his bike About what time is it?: 0 points Count backwards from 20 to 1: 0 points Say the months of the year in reverse: 0 points Repeat the address phrase from earlier: 0 points 6 CIT Score: 0 points  Advance Directives (For Healthcare) Does Patient Have a Medical Advance Directive?: Yes Type of Advance Directive: Healthcare Power of Attorney Copy of Healthcare Power of Attorney in Chart?: No - copy requested  Reviewed/Updated  Reviewed/Updated: Reviewed All (Medical, Surgical, Family, Medications, Allergies, Care Teams, Patient Goals); Medical History; Surgical History; Family History; Medications; Allergies; Care Teams; Patient Goals    Allergies (verified) Bee venom   Current Medications (verified) Outpatient Encounter Medications as of 04/24/2024  Medication Sig   Cholecalciferol (VITAMIN D3) 25 MCG (1000 UT) CAPS Take 1 capsule (1,000 Units total) by mouth daily.   famotidine  (PEPCID ) 20 MG tablet TAKE 1 TABLET(20 MG) BY MOUTH AT BEDTIME   loratadine (CLARITIN) 10 MG tablet Take 10 mg by mouth daily as needed. As needed for seasonal allergies   Multiple Vitamin (MULTIVITAMIN) tablet Take 1 tablet by mouth daily.   pantoprazole  (PROTONIX ) 40 MG tablet TAKE 1 TABLET(40 MG) BY MOUTH DAILY BEFORE BREAKFAST   No facility-administered encounter medications on file as of  04/24/2024.    History: Past Medical History:  Diagnosis Date   Allergy 63 yrs old   Not seriously allergic to bees   Closed die punch fracture distal radius, right,  initial encounter 04/25/2020   GERD (gastroesophageal reflux disease)    History of chicken pox    Influenza A 05/22/2018   Internal hemorrhoid    Low BP    post op    RAD (reactive airway disease)    ?of this vs mild asthma   Seasonal allergic rhinitis    tree pollen   Zenker's hypopharyngeal diverticulum 04/2015   Past Surgical History:  Procedure Laterality Date   COLONOSCOPY  01/20/2009   int hem, rpt 10 yrs [facility])   COLONOSCOPY  04/2019   WNL rpt 10 yrs [facility])   ESOPHAGOGASTRODUODENOSCOPY  2016   mod zenker's diverticulum, referred to tertiary center [facility])   HERNIA REPAIR  1963, 1966   ZENKER'S DIVERTICULECTOMY ENDOSCOPIC  08/2015   Dr Alvan at [facility] History  Problem Relation Age of Onset   Lung cancer Mother 24       lung (smoker)   Cancer Mother    Ulcers Father 85       s/p gastrectomy, stomach hemorrhage   Stroke Father 37       mild    AAA (abdominal aortic aneurysm) Father 8       smoker   Hearing loss Father    Diabetes Brother        borderline   CAD Maternal Grandfather    Colon cancer Neg Hx    Stomach cancer Neg Hx    Esophageal cancer Neg Hx    Rectal cancer Neg Hx    Colon polyps Neg Hx    Social History   Occupational History   Occupation: semi retired   Occupation: retired  Tobacco Use   Smoking status: Never   Smokeless tobacco: Never  Vaping Use   Vaping status: Never Used  Substance and Sexual Activity   Alcohol use: Yes    Alcohol/week: 21.0 standard drinks of alcohol    Types: 21 Standard drinks or equivalent per week    Comment: 3 glasses wine /day   Drug use: No   Sexual activity: Yes    Partners: Male   Tobacco Counseling Counseling given: Not Answered  SDOH Screenings   Food Insecurity: No Food Insecurity (04/24/2024)  Housing: Unknown (04/24/2024)  Transportation Needs: No Transportation Needs (04/24/2024)  Utilities: Not At Risk (04/24/2024)  Depression (PHQ2-9): Low Risk (04/24/2024)  Physical  Activity: Sufficiently Active (04/24/2024)  Social Connections: Moderately Integrated (04/24/2024)  Stress: No Stress Concern Present (04/24/2024)  Tobacco Use: Low Risk (04/24/2024)  Health Literacy: Adequate Health Literacy (04/24/2024)   See flowsheets for full screening details  Depression Screen PHQ 2 & 9 Depression Scale- Over the past 2 weeks, how often have you been bothered by any of the following problems? Little interest or pleasure in doing things: 0 Feeling down, depressed, or hopeless (PHQ Adolescent also includes...irritable): 0 PHQ-2 Total Score: 0 Trouble falling or staying asleep, or sleeping too much: 0 Feeling tired or having little energy: 0 Poor appetite or overeating (PHQ Adolescent also includes...weight loss): 0 Feeling bad about yourself - or that you are a failure or have let yourself or your family down: 0 Trouble concentrating on things, such as reading the newspaper or watching television (PHQ Adolescent also includes...like school work): 0 Moving or speaking so slowly that other people could have  noticed. Or the opposite - being so fidgety or restless that you have been moving around a lot more than usual: 0 Thoughts that you would be better off dead, or of hurting yourself in some way: 0 PHQ-9 Total Score: 0 If you checked off any problems, how difficult have these problems made it for you to do your work, take care of things at home, or get along with other people?: Not difficult at all      Goals Addressed             This Visit's Progress    Weight (lb) < 200 lb (90.7 kg)   155 lb (70.3 kg)            Objective:    Today's Vitals   04/24/24 1429  BP: 116/80  Pulse: 80  Temp: 97.9 F (36.6 C)  TempSrc: Oral  SpO2: 97%  Weight: 155 lb (70.3 kg)  Height: 5' 6.5 (1.689 m)   Body mass index is 24.64 kg/m.   Physical Exam{(optional), or other factors deemed appropriate based on the beneficiary's medical and social history and current  clinical standards. - TEXT BETWEEN BRACKETS WILL DISAPPEAR WHEN YOU SIGN THE ENCOUNTER:1}    Hearing/Vision screen No results found. Immunizations and Health Maintenance Health Maintenance  Topic Date Due   Hepatitis B Vaccines 19-59 Average Risk (1 of 3 - Risk 3-dose series) Never done   COVID-19 Vaccine (11 - 2025-26 season) 08/17/2024   DTaP/Tdap/Td (3 - Td or Tdap) 12/26/2024   Medicare Annual Wellness (AWV)  04/24/2025   Colonoscopy  05/04/2029   Pneumococcal Vaccine: 50+ Years  Completed   Influenza Vaccine  Completed   Hepatitis C Screening  Completed   HIV Screening  Completed   Zoster Vaccines- Shingrix  Completed   Meningococcal B Vaccine  Aged Out    EKG: {ekg findings:315101}     Assessment/Plan:  This is a routine wellness examination for Samuel Chapman.  Patient Care Team: Rilla Baller, MD as PCP - General (Family Medicine)  I have personally reviewed and noted the following in the patients chart:   Medical and social history Use of alcohol, tobacco or illicit drugs  Current medications and supplements including opioid prescriptions. Functional ability and status Nutritional status Physical activity Advanced directives List of other physicians Hospitalizations, surgeries, and ER visits in previous 12 months Vitals Screenings to include cognitive, depression, and falls Referrals and appointments  Orders Placed This Encounter  Procedures   EKG 12-Lead   In addition, I have reviewed and discussed with patient certain preventive protocols, quality metrics, and best practice recommendations. A written personalized care plan for preventive services as well as general preventive health recommendations were provided to patient.   Joellen Y Thompson, CMA   04/24/2024   Return in about 1 year (around 04/24/2025) for annual exam, prior fasting for blood work, medicare wellness visit.

## 2024-04-24 NOTE — Assessment & Plan Note (Addendum)
 Has previously been recommended hearing aides. Encouraged re eval by audiology - he will let me know if desires referral.

## 2024-04-24 NOTE — Assessment & Plan Note (Signed)

## 2024-04-24 NOTE — Assessment & Plan Note (Signed)
 Chronic, stable period on pepcid  and pantoprazole .

## 2024-04-24 NOTE — Assessment & Plan Note (Signed)
 Advanced directive - has this at home. Husband Massimo is HCPOA. Asked to bring us  copy to update chart.

## 2024-04-24 NOTE — Assessment & Plan Note (Addendum)
 Chronic, mild off medication. Reviewed diet choices to improve cholesterol control. Discussed cardiac CT with calcium  score with patient for further risk stratification - will order.  The 10-year ASCVD risk score (Arnett DK, et al., 2019) is: 9.1%   Values used to calculate the score:     Age: 64 years     Clinically relevant sex: Male     Is Non-Hispanic African American: No     Diabetic: No     Tobacco smoker: No     Systolic Blood Pressure: 116 mmHg     Is BP treated: No     HDL Cholesterol: 75.1 mg/dL     Total Cholesterol: 218 mg/dL

## 2024-04-24 NOTE — Assessment & Plan Note (Signed)
 Continues seasonal claritin. Evidence of ongoing nasal mucosal inflammation.  Will add INS, nasal saline irrigation.

## 2024-04-24 NOTE — Progress Notes (Signed)
 Ph: [phone] Fax: [phone]   Patient ID: Samuel Chapman, male    DOB: August 11, 1960, 63 y.o.   MRN: [id number]  This visit was conducted in person.  BP 116/80 (Cuff Size: Normal)   Pulse 80   Temp 97.9 F (36.6 C) (Oral)   Ht 5' 6.5 (1.689 m)   Wt 155 lb (70.3 kg)   SpO2 97%   BMI 24.64 kg/m    CC: welcome to medicare visit  Subjective:   HPI: Samuel Chapman is a 63 y.o. male presenting on 04/24/2024 for Annual Exam (Right nasal cavity gets congested every night, clear/green nasal discharge, onset 6- 8 months) and Medicare Wellness (Welcome to [employer] [facility] )   Received medicare 12/2023  Hearing Screening  Method: Audiometry   250Hz  500Hz  1000Hz  2000Hz  3000Hz  4000Hz  6000Hz  8000Hz   Right ear 35 35 40 50 55 50 50 60  Left ear 35 25 40 45 60 50 60 65   Vision Screening   Right eye Left eye Both eyes  Without correction     With correction 20/20 20/15 20/15     Flowsheet Row Office Visit from 04/24/2024 in [facility] [place]  PHQ-2 Total Score 0       04/24/2024    3:08 PM 04/24/2024    2:35 PM  Fall Risk   Falls in the past year? 1 1  Number falls in past yr: 1 1  Comment  issed a step on stairs  Injury with Fall? 1 1  Comment  hamstring injury, pt did PT  Risk for fall due to : No Fall Risks   Follow up Falls evaluation completed Falls evaluation completed    Not fasting for recent labs.   Did contract pneumonia 04/2024 - while in [place] s/p treatment with antibiotic.  Did receive prevnar-21 03/2024.   R hamstring tear 12/2023 walking up stairs carrying dog s/p PT course with resolution.   Zencker's diverticulum s/p diverticulectomy, intraluminal approach at [facility] 08/2015. Sees GI yearly ([facility]/Amy [facility]sterwood). Continues pantoprazole  40mg  daily, recently added pepcid  20mg  nightly. Recent barium swallow showed recurrence of Zenker's diverticulum. Subsequent EGD attempted last week, stopped early due to large posterior Zenker's  diverticulum with food present. Returned to Dr Alvan at [facility] for re-intervention s/p successful Z POEM procedure 07/2023.   Chronic R>L nasal congestion for several months. This is despite seasonal claritin (spring). Having some colored nasal discharge. No facial pain sinus pressure headache , fevers/chills. Tried flonase nasal spray with benefit.   Preventative: COLONOSCOPY 04/2019 - WNL rpt 10 yrs [facility])  Prostate cancer screening - nocturia x1. Yearly screen.  Lung cancer screening - not eligible  Flu yearly  COVID vaccines Moderna 07/2199 x2, booster 01/2020, bivalent booster 02/2021, 02/2022 Tdap 2016  Prevnar-21 03/2024  RSV - 02/2022  Shingrix - 04/2020, 06/2020 Advanced directive - has this at home. Husband Massimo is HCPOA. Asked to bring us  copy to update chart.  Seat belt use discussed  Sunscreen use discussed, no changing moles on skin.  Sleep - averaging 8 hours/night Non-smoker  Alcohol - 2-3 glasses of wine/day  Dentist regularly - had implant this year  Eye exam - yearly watching early cataracts   Lives with husband Photographer) Edu: university Occupation: works at [employer] Activity: gym with trainer twice weekly, pilates, walks dog daily, tennis twice weekly  Diet: good water, fruits/vegetables daily      Relevant past medical, surgical, family and social history reviewed and updated as indicated. Interim medical  history since our last visit reviewed. Allergies and medications reviewed and updated. Outpatient Medications Prior to Visit  Medication Sig Dispense Refill   Cholecalciferol (VITAMIN D3) 25 MCG (1000 UT) CAPS Take 1 capsule (1,000 Units total) by mouth daily. 30 capsule    famotidine  (PEPCID ) 20 MG tablet TAKE 1 TABLET(20 MG) BY MOUTH AT BEDTIME 90 tablet 4   loratadine (CLARITIN) 10 MG tablet Take 10 mg by mouth daily as needed. As needed for seasonal allergies     Multiple Vitamin (MULTIVITAMIN) tablet Take 1 tablet by mouth daily.      pantoprazole  (PROTONIX ) 40 MG tablet TAKE 1 TABLET(40 MG) BY MOUTH DAILY BEFORE BREAKFAST 90 tablet 2   No facility-administered medications prior to visit.     Per HPI unless specifically indicated in ROS section below Review of Systems  Objective:  BP 116/80 (Cuff Size: Normal)   Pulse 80   Temp 97.9 F (36.6 C) (Oral)   Ht 5' 6.5 (1.689 m)   Wt 155 lb (70.3 kg)   SpO2 97%   BMI 24.64 kg/m   Wt Readings from Last 3 Encounters:  04/24/24 155 lb (70.3 kg)  03/25/24 156 lb 12.8 oz (71.1 kg)  02/12/24 156 lb (70.8 kg)      Physical Exam Vitals and nursing note reviewed.  Constitutional:      General: He is not in acute distress.    Appearance: Normal appearance. He is well-developed. He is not ill-appearing.  HENT:     Head: Normocephalic and atraumatic.     Right Ear: Hearing, tympanic membrane, ear canal and external ear normal.     Left Ear: Hearing, tympanic membrane, ear canal and external ear normal.     Nose: Mucosal edema and congestion present. No rhinorrhea.     Right Turbinates: Swollen. Not enlarged or pale.     Left Turbinates: Swollen. Not enlarged or pale.     Right Sinus: No maxillary sinus tenderness or frontal sinus tenderness.     Left Sinus: No maxillary sinus tenderness or frontal sinus tenderness.     Comments: Nasal mucosal erythema     Mouth/Throat:     Mouth: Mucous membranes are moist.     Pharynx: Oropharynx is clear. No oropharyngeal exudate or posterior oropharyngeal erythema.  Eyes:     General: No scleral icterus.    Extraocular Movements: Extraocular movements intact.     Conjunctiva/sclera: Conjunctivae normal.     Pupils: Pupils are equal, round, and reactive to light.  Neck:     Thyroid : No thyroid  mass or thyromegaly.     Vascular: No carotid bruit.  Cardiovascular:     Rate and Rhythm: Normal rate and regular rhythm.     Pulses: Normal pulses.          Radial pulses are 2+ on the right side and 2+ on the left side.     Heart  sounds: Normal heart sounds. No murmur heard. Pulmonary:     Effort: Pulmonary effort is normal. No respiratory distress.     Breath sounds: Normal breath sounds. No wheezing, rhonchi or rales.  Abdominal:     General: Bowel sounds are normal. There is no distension.     Palpations: Abdomen is soft. There is no mass.     Tenderness: There is no abdominal tenderness. There is no guarding or rebound.     Hernia: No hernia is present.  Musculoskeletal:        General: Normal range of motion.  Cervical back: Normal range of motion and neck supple.     Right lower leg: No edema.     Left lower leg: No edema.  Lymphadenopathy:     Cervical: No cervical adenopathy.  Skin:    General: Skin is warm and dry.     Findings: No rash.  Neurological:     General: No focal deficit present.     Mental Status: He is alert and oriented to person, place, and time.  Psychiatric:        Mood and Affect: Mood normal.        Behavior: Behavior normal.        Thought Content: Thought content normal.        Judgment: Judgment normal.       Results for orders placed or performed in visit on 04/20/24  PSA, Medicare   Collection Time: 04/20/24  7:44 AM  Result Value Ref Range   PSA 1.13 0.10 - 4.00 ng/ml  Comprehensive metabolic panel with GFR   Collection Time: 04/20/24  7:44 AM  Result Value Ref Range   Sodium 140 135 - 145 mEq/L   Potassium 4.5 3.5 - 5.1 mEq/L   Chloride 102 96 - 112 mEq/L   CO2 29 19 - 32 mEq/L   Glucose, Bld 103 (H) 70 - 99 mg/dL   BUN 15 6 - 23 mg/dL   Creatinine, Ser 9.13 0.40 - 1.50 mg/dL   Total Bilirubin 0.5 0.2 - 1.2 mg/dL   Alkaline Phosphatase 62 39 - 117 U/L   AST 20 0 - 37 U/L   ALT 18 0 - 53 U/L   Total Protein 7.0 6.0 - 8.3 g/dL   Albumin 4.5 3.5 - 5.2 g/dL   GFR 09.01 >39.99 mL/min   Calcium  9.5 8.4 - 10.5 mg/dL  Lipid panel   Collection Time: 04/20/24  7:44 AM  Result Value Ref Range   Cholesterol 218 (H) 0 - 200 mg/dL   Triglycerides 20.9 0.0 -  149.0 mg/dL   HDL 24.89 >60.99 mg/dL   VLDL 84.1 0.0 - 59.9 mg/dL   LDL Cholesterol 872 (H) 0 - 99 mg/dL   Total CHOL/HDL Ratio 3    NonHDL 142.43    EKG - NSR rate 75, normal axis, intervals, no hypertrophy or acute ST/T changes , poor R wave progression  Assessment & Plan:   Problem List Items Addressed This Visit     Welcome to Medicare preventive visit - Primary (Chronic)   I have personally reviewed the Medicare Annual Wellness questionnaire and have noted 1. The patient's medical and social history 2. Their use of alcohol, tobacco or illicit drugs 3. Their current medications and supplements 4. The patient's functional ability including ADL's, fall risks, home safety risks and hearing or visual impairment. Cognitive function has been assessed and addressed as indicated.  5. Diet and physical activity 6. Evidence for depression or mood disorders The patients weight, height, BMI have been recorded in the chart. I have made referrals, counseling and provided education to the patient based on review of the above and I have provided the pt with a written personalized care plan for preventive services. Provider list updated.. See scanned questionairre as needed for further documentation. Reviewed preventative protocols and updated unless pt declined.       Relevant Orders   EKG 12-Lead (Completed)   Advanced directives, counseling/discussion (Chronic)   Advanced directive - has this at home. Husband Massimo is HCPOA. Asked to bring us  copy  to update chart.       Seasonal allergic rhinitis   Continues seasonal claritin. Evidence of ongoing nasal mucosal inflammation.  Will add INS, nasal saline irrigation.       GERD (gastroesophageal reflux disease)   Chronic, stable period on pepcid  and pantoprazole .       HLD (hyperlipidemia)   Chronic, mild off medication. Reviewed diet choices to improve cholesterol control. Discussed cardiac CT with calcium  score with patient for  further risk stratification - will order.  The 10-year ASCVD risk score (Arnett DK, et al., 2019) is: 9.1%   Values used to calculate the score:     Age: 2 years     Clinically relevant sex: Male     Is Non-Hispanic African American: No     Diabetic: No     Tobacco smoker: No     Systolic Blood Pressure: 116 mmHg     Is BP treated: No     HDL Cholesterol: 75.1 mg/dL     Total Cholesterol: 218 mg/dL       Relevant Orders   CT CARDIAC SCORING (SELF PAY ONLY)   History of excision of Zenker's diverticulum   Great effect after 2nd procedure for zenker's diverticulum      Bilateral hearing loss   Has previously been recommended hearing aides. Encouraged re eval by audiology - he will let me know if desires referral.         No orders of the defined types were placed in this encounter.   Orders Placed This Encounter  Procedures   CT CARDIAC SCORING (SELF PAY ONLY)    Standing Status:   Future    Expiration Date:   04/24/2025    Preferred imaging location?:   [facility]   EKG 12-Lead    Patient Instructions  EKG today  For nasal congestion - start flonase 1-2 squirts into each nostril daily.  Work on [employer] [facility] choices to improve cholesterol levels.  Good to see you today Return as needed or in 1 year for next physical /wellness visit.   Follow up plan: Return in about 1 year (around 04/24/2025) for annual exam, prior fasting for blood work, medicare wellness visit.  Anton Blas, MD

## 2024-04-25 NOTE — Assessment & Plan Note (Signed)
 Great effect after 2nd procedure for zenker's diverticulum

## 2024-06-11 ENCOUNTER — Other Ambulatory Visit (HOSPITAL_BASED_OUTPATIENT_CLINIC_OR_DEPARTMENT_OTHER)

## 2025-04-20 ENCOUNTER — Other Ambulatory Visit

## 2025-04-27 ENCOUNTER — Encounter: Admitting: Family Medicine
